# Patient Record
Sex: Female | Born: 1950 | Race: White | Hispanic: No | Marital: Married | Smoking: Former smoker
Health system: Southern US, Community
[De-identification: ages and names within clinical notes are randomized; demographics above are authoritative.]

## PROBLEM LIST (undated history)

## (undated) DIAGNOSIS — E785 Hyperlipidemia, unspecified: Secondary | ICD-10-CM

## (undated) DIAGNOSIS — J309 Allergic rhinitis, unspecified: Secondary | ICD-10-CM

## (undated) DIAGNOSIS — I10 Essential (primary) hypertension: Secondary | ICD-10-CM

## (undated) DIAGNOSIS — B019 Varicella without complication: Secondary | ICD-10-CM

## (undated) DIAGNOSIS — E119 Type 2 diabetes mellitus without complications: Secondary | ICD-10-CM

## (undated) DIAGNOSIS — Z9289 Personal history of other medical treatment: Secondary | ICD-10-CM

## (undated) DIAGNOSIS — R339 Retention of urine, unspecified: Secondary | ICD-10-CM

## (undated) DIAGNOSIS — J45909 Unspecified asthma, uncomplicated: Secondary | ICD-10-CM

## (undated) DIAGNOSIS — R519 Headache, unspecified: Secondary | ICD-10-CM

## (undated) DIAGNOSIS — C801 Malignant (primary) neoplasm, unspecified: Secondary | ICD-10-CM

## (undated) DIAGNOSIS — G459 Transient cerebral ischemic attack, unspecified: Secondary | ICD-10-CM

## (undated) DIAGNOSIS — K219 Gastro-esophageal reflux disease without esophagitis: Secondary | ICD-10-CM

## (undated) DIAGNOSIS — R51 Headache: Secondary | ICD-10-CM

## (undated) DIAGNOSIS — K5792 Diverticulitis of intestine, part unspecified, without perforation or abscess without bleeding: Secondary | ICD-10-CM

## (undated) HISTORY — DX: Retention of urine, unspecified: R33.9

## (undated) HISTORY — DX: Hyperlipidemia, unspecified: E78.5

## (undated) HISTORY — DX: Personal history of other medical treatment: Z92.89

## (undated) HISTORY — DX: Unspecified asthma, uncomplicated: J45.909

## (undated) HISTORY — PX: ABDOMINAL HYSTERECTOMY: SHX81

## (undated) HISTORY — DX: Headache, unspecified: R51.9

## (undated) HISTORY — DX: Diverticulitis of intestine, part unspecified, without perforation or abscess without bleeding: K57.92

## (undated) HISTORY — DX: Varicella without complication: B01.9

## (undated) HISTORY — DX: Headache: R51

## (undated) HISTORY — DX: Gastro-esophageal reflux disease without esophagitis: K21.9

## (undated) HISTORY — DX: Allergic rhinitis, unspecified: J30.9

---

## 1985-11-10 HISTORY — PX: ABDOMINAL HYSTERECTOMY: SUR658

## 1986-11-10 HISTORY — PX: CHOLECYSTECTOMY: SHX55

## 2005-08-04 ENCOUNTER — Ambulatory Visit: Payer: Self-pay | Admitting: Unknown Physician Specialty

## 2006-07-31 ENCOUNTER — Ambulatory Visit: Payer: Self-pay | Admitting: Internal Medicine

## 2006-08-26 ENCOUNTER — Ambulatory Visit: Payer: Self-pay | Admitting: Unknown Physician Specialty

## 2007-10-12 ENCOUNTER — Ambulatory Visit: Payer: Self-pay | Admitting: Unknown Physician Specialty

## 2007-10-27 ENCOUNTER — Ambulatory Visit: Payer: Self-pay | Admitting: Unknown Physician Specialty

## 2007-12-20 ENCOUNTER — Ambulatory Visit: Payer: Self-pay | Admitting: Internal Medicine

## 2008-02-28 ENCOUNTER — Encounter: Admission: RE | Admit: 2008-02-28 | Discharge: 2008-02-28 | Payer: Self-pay | Admitting: Neurology

## 2008-10-30 ENCOUNTER — Ambulatory Visit: Payer: Self-pay | Admitting: General Surgery

## 2009-11-07 ENCOUNTER — Ambulatory Visit: Payer: Self-pay | Admitting: Unknown Physician Specialty

## 2010-03-14 ENCOUNTER — Ambulatory Visit: Payer: Self-pay | Admitting: Internal Medicine

## 2011-04-02 ENCOUNTER — Ambulatory Visit: Payer: Self-pay | Admitting: Internal Medicine

## 2012-06-30 ENCOUNTER — Ambulatory Visit: Payer: Self-pay | Admitting: Internal Medicine

## 2013-03-09 ENCOUNTER — Emergency Department: Payer: Self-pay | Admitting: Emergency Medicine

## 2013-03-09 LAB — COMPREHENSIVE METABOLIC PANEL
Albumin: 4.1 g/dL (ref 3.4–5.0)
BUN: 19 mg/dL — ABNORMAL HIGH (ref 7–18)
Bilirubin,Total: 0.4 mg/dL (ref 0.2–1.0)
Calcium, Total: 9.2 mg/dL (ref 8.5–10.1)
Chloride: 105 mmol/L (ref 98–107)
Co2: 24 mmol/L (ref 21–32)
Creatinine: 0.9 mg/dL (ref 0.60–1.30)
EGFR (Non-African Amer.): >60
Glucose: 137 mg/dL — ABNORMAL HIGH (ref 65–99)
SGOT(AST): 46 U/L — ABNORMAL HIGH (ref 15–37)
Sodium: 137 mmol/L (ref 136–145)

## 2013-03-09 LAB — URINALYSIS, COMPLETE
Bilirubin,UR: NEGATIVE
Ketone: NEGATIVE
Leukocyte Esterase: NEGATIVE
Ph: 5 (ref 4.5–8.0)
RBC,UR: 4 /HPF (ref 0–5)
WBC UR: 1 /HPF (ref 0–5)

## 2013-03-09 LAB — CBC
HCT: 42.5 % (ref 35.0–47.0)
HGB: 14.6 g/dL (ref 12.0–16.0)
MCH: 30.7 pg (ref 26.0–34.0)
MCHC: 34.4 g/dL (ref 32.0–36.0)
RDW: 13.4 % (ref 11.5–14.5)

## 2013-07-06 ENCOUNTER — Ambulatory Visit: Payer: Self-pay | Admitting: Internal Medicine

## 2013-08-03 ENCOUNTER — Ambulatory Visit: Payer: Self-pay | Admitting: Gastroenterology

## 2013-09-13 ENCOUNTER — Ambulatory Visit: Payer: Self-pay | Admitting: Internal Medicine

## 2014-07-25 ENCOUNTER — Ambulatory Visit: Payer: Self-pay | Admitting: Internal Medicine

## 2014-10-10 DEATH — deceased

## 2014-10-23 ENCOUNTER — Ambulatory Visit: Payer: Self-pay | Admitting: Internal Medicine

## 2014-11-10 ENCOUNTER — Ambulatory Visit: Payer: Self-pay | Admitting: Internal Medicine

## 2014-12-11 ENCOUNTER — Ambulatory Visit: Payer: Self-pay | Admitting: Internal Medicine

## 2015-10-18 ENCOUNTER — Emergency Department
Admission: EM | Admit: 2015-10-18 | Discharge: 2015-10-18 | Disposition: A | Discharge status: Home / Self Care | Payer: 59 | Attending: Emergency Medicine | Admitting: Emergency Medicine

## 2015-10-18 ENCOUNTER — Emergency Department: Payer: 59

## 2015-10-18 ENCOUNTER — Encounter: Payer: Self-pay | Admitting: *Deleted

## 2015-10-18 DIAGNOSIS — I10 Essential (primary) hypertension: Secondary | ICD-10-CM | POA: Insufficient documentation

## 2015-10-18 DIAGNOSIS — D649 Anemia, unspecified: Secondary | ICD-10-CM | POA: Diagnosis not present

## 2015-10-18 DIAGNOSIS — R0602 Shortness of breath: Secondary | ICD-10-CM | POA: Diagnosis present

## 2015-10-18 DIAGNOSIS — Z880 Allergy status to penicillin: Secondary | ICD-10-CM | POA: Diagnosis not present

## 2015-10-18 DIAGNOSIS — Z87891 Personal history of nicotine dependence: Secondary | ICD-10-CM | POA: Insufficient documentation

## 2015-10-18 DIAGNOSIS — J40 Bronchitis, not specified as acute or chronic: Secondary | ICD-10-CM

## 2015-10-18 DIAGNOSIS — J209 Acute bronchitis, unspecified: Secondary | ICD-10-CM | POA: Diagnosis not present

## 2015-10-18 DIAGNOSIS — E119 Type 2 diabetes mellitus without complications: Secondary | ICD-10-CM | POA: Insufficient documentation

## 2015-10-18 HISTORY — DX: Essential (primary) hypertension: I10

## 2015-10-18 HISTORY — DX: Type 2 diabetes mellitus without complications: E11.9

## 2015-10-18 LAB — CBC WITH DIFFERENTIAL/PLATELET
BASOS ABS: 0.2 10*3/uL — AB (ref 0–0.1)
BASOS PCT: 2 %
EOS ABS: 0.3 10*3/uL (ref 0–0.7)
EOS PCT: 3 %
HCT: 30.8 % — ABNORMAL LOW (ref 35.0–47.0)
Hemoglobin: 9.5 g/dL — ABNORMAL LOW (ref 12.0–16.0)
Lymphocytes Relative: 45 %
Lymphs Abs: 4.6 10*3/uL — ABNORMAL HIGH (ref 1.0–3.6)
MCH: 21.4 pg — ABNORMAL LOW (ref 26.0–34.0)
MCHC: 30.7 g/dL — ABNORMAL LOW (ref 32.0–36.0)
MCV: 69.8 fL — ABNORMAL LOW (ref 80.0–100.0)
MONO ABS: 0.6 10*3/uL (ref 0.2–0.9)
Monocytes Relative: 6 %
Neutro Abs: 4.3 10*3/uL (ref 1.4–6.5)
Neutrophils Relative %: 44 %
PLATELETS: 370 10*3/uL (ref 150–440)
RBC: 4.41 MIL/uL (ref 3.80–5.20)
RDW: 18.6 % — AB (ref 11.5–14.5)
WBC: 10 10*3/uL (ref 3.6–11.0)

## 2015-10-18 LAB — TROPONIN I

## 2015-10-18 LAB — BASIC METABOLIC PANEL
ANION GAP: 9 (ref 5–15)
BUN: 15 mg/dL (ref 6–20)
CALCIUM: 9.7 mg/dL (ref 8.9–10.3)
CO2: 26 mmol/L (ref 22–32)
Chloride: 106 mmol/L (ref 101–111)
Creatinine, Ser: 0.69 mg/dL (ref 0.44–1.00)
GFR calc Af Amer: >60 mL/min (ref >60)
GLUCOSE: 114 mg/dL — AB (ref 65–99)
POTASSIUM: 3.8 mmol/L (ref 3.5–5.1)
SODIUM: 141 mmol/L (ref 135–145)

## 2015-10-18 MED ORDER — ALBUTEROL SULFATE (2.5 MG/3ML) 0.083% IN NEBU
5.0000 mg | INHALATION_SOLUTION | Freq: Once | RESPIRATORY_TRACT | Status: AC
  Administered 2015-10-18: 5 mg via RESPIRATORY_TRACT
  Filled 2015-10-18: qty 6

## 2015-10-18 MED ORDER — IPRATROPIUM-ALBUTEROL 0.5-2.5 (3) MG/3ML IN SOLN
RESPIRATORY_TRACT | Status: AC
  Administered 2015-10-18: 3 mL via RESPIRATORY_TRACT
  Filled 2015-10-18: qty 3

## 2015-10-18 MED ORDER — AZITHROMYCIN 250 MG PO TABS
500.0000 mg | ORAL_TABLET | Freq: Once | ORAL | Status: AC
  Administered 2015-10-18: 500 mg via ORAL
  Filled 2015-10-18: qty 2

## 2015-10-18 MED ORDER — AZITHROMYCIN 250 MG PO TABS: ORAL_TABLET | ORAL | Status: DC

## 2015-10-18 MED ORDER — PREDNISONE 20 MG PO TABS
60.0000 mg | ORAL_TABLET | Freq: Once | ORAL | Status: AC
  Administered 2015-10-18: 60 mg via ORAL
  Filled 2015-10-18: qty 3

## 2015-10-18 MED ORDER — IPRATROPIUM-ALBUTEROL 0.5-2.5 (3) MG/3ML IN SOLN
3.0000 mL | Freq: Once | RESPIRATORY_TRACT | Status: AC
  Administered 2015-10-18: 3 mL via RESPIRATORY_TRACT

## 2015-10-18 MED ORDER — ALBUTEROL SULFATE HFA 108 (90 BASE) MCG/ACT IN AERS: 2.0000 | INHALATION_SPRAY | Freq: Four times a day (QID) | RESPIRATORY_TRACT | Status: AC | PRN

## 2015-10-18 MED ORDER — PREDNISONE 20 MG PO TABS: 60.0000 mg | ORAL_TABLET | Freq: Every day | ORAL | Status: DC

## 2015-10-18 NOTE — ED Notes (Signed)
Patient transported to X-ray 

## 2015-10-18 NOTE — ED Notes (Signed)
Pt states has had cough, lightheadedness, and weak for about 1 year, non productive, tightness to chest with coughing.  States dx with HTN and DBM about 1 year ago as well.  Pt sounds clear and diminished after first breathing treatment in ED, chest rise symmetrical.

## 2015-10-18 NOTE — ED Provider Notes (Addendum)
[facility] Emergency Department Provider Note  ____________________________________________  Time seen: Approximately 7 PM  I have reviewed the triage vital signs and the nursing notes.   HISTORY  Chief Complaint Cough and Shortness of Breath    HPI Tamara Summers is a 64 y.o. female with a history of one year of cough was presenting today with shortness of breath. She said that she had a coughing spell several hours ago and is now complaining of tightness in her chest with coughing and difficulty breathing. She says that she is a former smoker but no longer smokes. Says that she has had to use nebulizer treatments intermittently at home for relief of her coughing.Says that she has also seen a pulmonologist and she says that she may have COPD but does not have a definitive diagnosis at this time. Denies any fevers. The chest tightness is diffuse and worse with breathing.   Past Medical History  Diagnosis Date  . Hypertension   . Diabetes mellitus without complication ([place])     There are no active problems to display for this patient.   History reviewed. No pertinent past surgical history.  No current outpatient prescriptions on file.  Allergies Penicillins and Sulfa antibiotics  History reviewed. No pertinent family history.  Social History Social History  Substance Use Topics  . Smoking status: Former Research scientist (life sciences)  . Smokeless tobacco: None  . Alcohol Use: None    Review of Systems Constitutional: No fever/chills Eyes: No visual changes. ENT: No sore throat. Cardiovascular: "Tightness" as above Respiratory: As above  Gastrointestinal: No abdominal pain.  No nausea, no vomiting.  No diarrhea.  No constipation. Genitourinary: Negative for dysuria. Musculoskeletal: Negative for back pain. Skin: Negative for rash. Neurological: Negative for headaches, focal weakness or numbness.  10-point ROS otherwise  negative.  ____________________________________________   PHYSICAL EXAM:  VITAL SIGNS: ED Triage Vitals  Enc Vitals Group     BP 10/18/15 1819 159/99 mmHg     Pulse Rate 10/18/15 1819 99     Resp 10/18/15 1819 20     Temp 10/18/15 1819 98.1 F (36.7 C)     Temp Source 10/18/15 1819 Oral     SpO2 10/18/15 1819 98 %     Weight 10/18/15 1819 138 lb (62.596 kg)     Height 10/18/15 1819 5\' 3"  (1.6 m)     Head Cir --      Peak Flow --      Pain Score 10/18/15 1819 0     Pain Loc --      Pain Edu? --      Excl. in [place]? --     Constitutional: Alert and oriented. Well appearing and in no acute distress. Eyes: Conjunctivae are normal. PERRL. EOMI. Head: Atraumatic. Nose: No congestion/rhinnorhea. Mouth/Throat: Mucous membranes are moist.  Oropharynx non-erythematous. Neck: No stridor.   Cardiovascular: Normal rate, regular rhythm. Grossly normal heart sounds.  Good peripheral circulation. Respiratory: Normal respiratory effort.  No retractions. Decreased air movement throughout with expiratory wheeze as well as prolonged expiratory phase. Gastrointestinal: Soft and nontender. No distention. No abdominal bruits. No CVA tenderness. Musculoskeletal: No lower extremity tenderness nor edema.  No joint effusions. Neurologic:  Normal speech and language. No gross focal neurologic deficits are appreciated. No gait instability. Skin:  Skin is warm, dry and intact. No rash noted. Psychiatric: Mood and affect are normal. Speech and behavior are normal.  ____________________________________________   LABS (all labs ordered are listed, but only abnormal results are  displayed)  Labs Reviewed  CBC WITH DIFFERENTIAL/PLATELET - Abnormal; Notable for the following:    Hemoglobin 9.5 (*)    HCT 30.8 (*)    MCV 69.8 (*)    MCH 21.4 (*)    MCHC 30.7 (*)    RDW 18.6 (*)    Lymphs Abs 4.6 (*)    Basophils Absolute 0.2 (*)    All other components within normal limits  BASIC METABOLIC PANEL -  Abnormal; Notable for the following:    Glucose, Bld 114 (*)    All other components within normal limits  TROPONIN I   ____________________________________________  EKG  ED ECG REPORT I, Doran Stabler, the attending physician, personally viewed and interpreted this ECG.   Date: 10/18/2015  EKG Time: 1823  Rate: 96  Rhythm: normal sinus rhythm  Axis: Normal axis  Intervals:none  ST&T Change: No ST segment elevation or depressions. No abnormal T-wave inversion.  ____________________________________________  RADIOLOGY  Mild peribronchial thickening without any consolidation. ____________________________________________   PROCEDURES   ____________________________________________   INITIAL IMPRESSION / ASSESSMENT AND PLAN / ED COURSE  Pertinent labs & imaging results that were available during my care of the patient were reviewed by me and considered in my medical decision making (see chart for details).  ----------------------------------------- 8:34 PM on 10/18/2015 -----------------------------------------  Patient resting comfortably at this time with improved breathing without any shortness of breath, tightness or chest pain. Auscultation of the chest with clear lungs without any wheezing or prolonged expiratory phase or x-ray cough. Workup is reassuring except for a hemoglobin of 9.5 down from 14 in 2014. Patient does say that she has felt increasingly weak over the past year. Denies any noticeable bleeding in her stool says that she has had hematuria chronically and is scheduled to see a urologist next week. I did a rectal exam with grossly brown stool that was Hemoccult negative. I advised the patient that she should start iron over-the-counter. She'll follow up with her primary care doctor. The anemia appears chronic ____________________________________________   FINAL CLINICAL IMPRESSION(S) / ED DIAGNOSES  Bronchitis. Anemia.    Orbie Pyo, MD 10/18/15 2035  Orbie Pyo, MD 10/18/15 2039

## 2015-10-18 NOTE — ED Notes (Signed)
Pt states she had a coughing spell this afternoon and now states SOB and tightness, audible wheezing herard

## 2015-10-29 ENCOUNTER — Encounter: Payer: Self-pay | Admitting: Family Medicine

## 2015-10-29 ENCOUNTER — Ambulatory Visit (INDEPENDENT_AMBULATORY_CARE_PROVIDER_SITE_OTHER): Payer: 59 | Admitting: Family Medicine

## 2015-10-29 VITALS — BP 122/78 | HR 91 | Temp 97.9°F | Ht 64.0 in | Wt 139.8 lb

## 2015-10-29 DIAGNOSIS — E119 Type 2 diabetes mellitus without complications: Secondary | ICD-10-CM | POA: Diagnosis not present

## 2015-10-29 DIAGNOSIS — Z1211 Encounter for screening for malignant neoplasm of colon: Secondary | ICD-10-CM

## 2015-10-29 DIAGNOSIS — R0602 Shortness of breath: Secondary | ICD-10-CM | POA: Insufficient documentation

## 2015-10-29 DIAGNOSIS — R000 Tachycardia, unspecified: Secondary | ICD-10-CM

## 2015-10-29 DIAGNOSIS — D649 Anemia, unspecified: Secondary | ICD-10-CM

## 2015-10-29 NOTE — Assessment & Plan Note (Addendum)
Diagnosed with anemia in the ED a week and a half ago. Hemoglobin was 9.5. Unknown cause. Suspect that her symptoms of shortness of breath and increased heart rate are related to her anemia. She's been evaluated by urology given her history of hematuria and per the patient this evaluation was negative. She does not have any vaginal bleeding as she had a hysterectomy many years ago. She denies rectal bleeding and had negative FOBT in the ED. Suspect iron deficiency anemia given MCV, but the question is why is she anemic. I did discuss that a single FOBT in the ED is not a good way to rule out rectal bleeding. We will give her stool cards and refer her to GI for colonoscopy. We will obtain iron studies today. We'll recheck her hemoglobin today. Given return precautions.

## 2015-10-29 NOTE — Patient Instructions (Signed)
Nice to meet you. Please continue the iron supplement. We will check iron studies today and a repeat hemoglobin. We will give the stool cards that she needed to complete. We'll refer you to GI. We will also check an A1c for your diabetes. If you develop trouble breathing, palpitations, chest pain or pressure, swelling of one leg or the other, cough, fever, or any new or change in symptoms please seek medical attention.

## 2015-10-29 NOTE — Assessment & Plan Note (Addendum)
Patient with multiple likely causes of this issue. Suspect is related to her anemia and the acute bronchitis that she had. This issue has improved, though does mildly persist. Discussed possible other causes including COPD, asthma, cardiac cause, or VTE. Patient does not have any risk factors for VTE and has a wells score of 0 today. Doubt cardiac cause given normal EKG and troponin in the ED. Could be COPD or asthma related given that this improved with treatment of her bronchitis with steroids and an inhaler. Lung sounds are normal today. Oxygenation is normal today. Doubt persistent infectious cause. Did discuss having her evaluated by pulmonology for COPD or cardiology for cardiac cause, though patient opted to focus on anemia as a cause as this is most likely cause of this issue right now. We will treat her anemia and work it up per the anemia problem. She will continue to monitor. She is given return precautions.

## 2015-10-29 NOTE — Progress Notes (Signed)
Patient ID: Tamara Summers, female   DOB: [birth year], 64 y.o.   MRN: [id number]  [name], MD Phone: [phone]  Tamara Summers is a 64 y.o. female who presents today for new patient visit.  Patient notes she was diagnosed with anemia in the ED on 10/18/15. Hemoglobin was 9.5. She notes she had gone to the ED as she had felt tired for the past year and has shortness of breath with exertion for the past year. She also noted her heart rate had been high when checked at home. She notes that the day she went to the emergency room she started wheezing and having tightness in her chest. She did not have any chest pressure or fevers or hemoptysis with this. She was diagnosed with an acute bronchitis and anemia as likely causes of her symptoms. She was given a breathing treatment and sent home with steroids, and antibiotics, and an inhaler. She was advised to take iron. She notes a history of asthma and possible COPD. She possibly has had hematuria in the past, though saw urology at [facility] about a week ago and they could not find a cause per the patient. She had a hysterectomy in the 1980s and has not had any vaginal bleeding since then. She had a colonoscopy 6-7 years ago that found diverticulosis. She denies blood per rectum. She had a negative FOBT in the ED. She notes since being treated for acute bronchitis the tightness in her chest and shortness of breath have improved. She does still note some mild shortness of breath with exercising. No orthopnea or PND. No recent surgeries or long trips. No history of malignancy. She does note that occasionally her pulse feels like it's racing. This occurs most days at home. Typically in the 108-110 range. She is asymptomatic otherwise with that. Other ED workup included a normal EKG and a negative troponin. Chest x-ray was consistent with bronchitis.  DIABETES Disease Monitoring: Blood Sugar ranges-typically 140-160, though recently has been 180s to  230s Polyuria/phagia/dipsia-some polydipsia      Medications: Compliance-takes metformin XR 1000 mg once daily and glimepiride 2 mg daily Hypoglycemic symptoms- no   Active Ambulatory Problems    Diagnosis Date Noted  . Anemia 10/29/2015  . Shortness of breath 10/29/2015  . Increased heart rate 10/29/2015  . Diabetes mellitus type 2, controlled, without complications ([place]) 0000000   Resolved Ambulatory Problems    Diagnosis Date Noted  . No Resolved Ambulatory Problems   Past Medical History  Diagnosis Date  . Hypertension   . Diabetes mellitus without complication ([place])   . Asthma   . Chickenpox   . Diverticulitis   . Headache   . GERD (gastroesophageal reflux disease)   . Allergic rhinitis   . Hyperlipidemia   . History of blood transfusion   . Urinary retention     Family History  Problem Relation Age of Onset  . Hyperlipidemia      Parent  . Heart disease      Parent  . Hypertension      Parent  . Stroke      Grandparent  . Mental illness Sister     Social History   Social History  . Marital Status: Married    Spouse Name: N/A  . Number of Children: N/A  . Years of Education: N/A   Occupational History  . Not on file.   Social History Main Topics  . Smoking status: Former Research scientist (life sciences)  . Smokeless tobacco: Not on file  .  Alcohol Use: 1.2 - 1.8 oz/week    2-3 Standard drinks or equivalent per week  . Drug Use: No  . Sexual Activity: Not on file   Other Topics Concern  . Not on file   Social History Narrative    ROS   General:  Negative for nexplained weight loss, fever Skin: Negative for new or changing mole, sore that won't heal HEENT: Negative for trouble hearing, trouble seeing, ringing in ears, mouth sores, hoarseness, change in voice, dysphagia. CV:  Positive for dyspnea and increased heart rate, Negative for chest pain, edema Resp: Positive for cough, dyspnea, Negative for hemoptysis GI: Negative for nausea, vomiting, diarrhea,  constipation, abdominal pain, melena, hematochezia. GU: Negative for dysuria, incontinence, urinary hesitance, hematuria, vaginal or penile discharge, polyuria, sexual difficulty, lumps in testicle or breasts MSK: Negative for muscle cramps or aches, joint pain or swelling Neuro: Negative for headaches, weakness, numbness, dizziness, passing out/fainting Psych: Negative for depression, anxiety, memory problems  Objective  Physical Exam Filed Vitals:   10/29/15 0812  BP: 122/78  Pulse: 91  Temp: 97.9 F (36.6 C)    BP Readings from Last 3 Encounters:  10/29/15 122/78  10/18/15 157/73   Wt Readings from Last 3 Encounters:  10/29/15 139 lb 12.8 oz (63.413 kg)  10/18/15 138 lb (62.596 kg)    Physical Exam  Constitutional: She is well-developed, well-nourished, and in no distress.  HENT:  Head: Normocephalic and atraumatic.  Right Ear: External ear normal.  Left Ear: External ear normal.  Mouth/Throat: Oropharynx is clear and moist. No oropharyngeal exudate.  Eyes: Pupils are equal, round, and reactive to light.  Mild conjunctival pallor  Neck: Neck supple.  Cardiovascular: Normal rate, regular rhythm and normal heart sounds.  Exam reveals no gallop and no friction rub.   No murmur heard. Pulmonary/Chest: Effort normal and breath sounds normal. No respiratory distress. She has no wheezes. She has no rales.  Abdominal: Soft. Bowel sounds are normal. She exhibits no distension. There is no tenderness. There is no rebound and no guarding.  Musculoskeletal: She exhibits no edema.  No calf swelling or tenderness  Lymphadenopathy:    She has no cervical adenopathy.  Neurological: She is alert. Gait normal.  Skin: Skin is warm and dry. She is not diaphoretic.  Psychiatric: Mood and affect normal.     Assessment/Plan:   Anemia Diagnosed with anemia in the ED a week and a half ago. Hemoglobin was 9.5. Unknown cause. Suspect that her symptoms of shortness of breath and increased  heart rate are related to her anemia. She's been evaluated by urology given her history of hematuria and per the patient this evaluation was negative. She does not have any vaginal bleeding as she had a hysterectomy many years ago. She denies rectal bleeding and had negative FOBT in the ED. Suspect iron deficiency anemia given MCV, but the question is why is she anemic. I did discuss that a single FOBT in the ED is not a good way to rule out rectal bleeding. We will give her stool cards and refer her to GI for colonoscopy. We will obtain iron studies today. We'll recheck her hemoglobin today. Given return precautions.  Shortness of breath Patient with multiple likely causes of this issue. Suspect is related to her anemia and the acute bronchitis that she had. This issue has improved, though does mildly persist. Discussed possible other causes including COPD, asthma, cardiac cause, or VTE. Patient does not have any risk factors for VTE and  has a wells score of 0 today. Doubt cardiac cause given normal EKG and troponin in the ED. Could be COPD or asthma related given that this improved with treatment of her bronchitis with steroids and an inhaler. Lung sounds are normal today. Oxygenation is normal today. Doubt persistent infectious cause. Did discuss having her evaluated by pulmonology for COPD or cardiology for cardiac cause, though patient opted to focus on anemia as a cause as this is most likely cause of this issue right now. We will treat her anemia and work it up per the anemia problem. She will continue to monitor. She is given return precautions.  Increased heart rate Patient report she has increased heart rate at home. Suspect this is related to the anemia. She had an EKG in the ED that was normal. Discussed other causes with patient. Did discuss cardiology referral, the patient opted to treat the anemia and see how this responded prior to proceeding with any further workup. Given return  precautions.  Diabetes mellitus type 2, controlled, without complications ([place]) Seems to have been well controlled until recently. We will check an A1c today. She will continue her metformin and glimepiride.    Orders Placed This Encounter  Procedures  . CBC with Differential/Platelet    Standing Status: Future     Number of Occurrences: 1     Standing Expiration Date: 10/28/2016  . Ferritin  . Hemoglobin A1c  . Iron Binding Cap (TIBC)  . Ambulatory referral to Gastroenterology    Referral Priority:  Routine    Referral Type:  Consultation    Referral Reason:  Specialty Services Required    Number of Visits Requested:  1    Dragon voice recognition software was used during the dictation process of this note. If any phrases or words seem inappropriate it is likely secondary to the translation process being inefficient.  [name]

## 2015-10-29 NOTE — Progress Notes (Signed)
Pre visit review using our clinic review tool, if applicable. No additional management support is needed unless otherwise documented below in the visit note. 

## 2015-10-29 NOTE — Assessment & Plan Note (Signed)
Seems to have been well controlled until recently. We will check an A1c today. She will continue her metformin and glimepiride.

## 2015-10-29 NOTE — Assessment & Plan Note (Addendum)
Patient report she has increased heart rate at home. Suspect this is related to the anemia. She had an EKG in the ED that was normal. Discussed other causes with patient. Did discuss cardiology referral, the patient opted to treat the anemia and see how this responded prior to proceeding with any further workup. Given return precautions.

## 2015-10-29 NOTE — Progress Notes (Deleted)
Patient ID: Tamara Summers, female   DOB: 10-Feb-1951, 64 y.o.   MRN: [id number]  [name], MD Phone: [phone]  Tamara Summers is a 64 y.o. female who presents today for ***  ***  PMH: nonsmoker ***   ROS  Objective  Physical Exam Filed Vitals:   10/29/15 0812  BP: 122/78  Pulse: 91  Temp: 97.9 F (36.6 C)    BP Readings from Last 3 Encounters:  10/29/15 122/78  10/18/15 157/73   Wt Readings from Last 3 Encounters:  10/29/15 139 lb 12.8 oz (63.413 kg)  10/18/15 138 lb (62.596 kg)    Physical Exam   Assessment/Plan: Please see individual problem list.  No problem-specific assessment & plan notes found for this encounter.   Orders Placed This Encounter  Procedures  . CBC  . Iron and TIBC  . Ferritin  . Hemoglobin A1C  . Ambulatory referral to Gastroenterology    Referral Priority:  Routine    Referral Type:  Consultation    Referral Reason:  Specialty Services Required    Number of Visits Requested:  1    Meds ordered this encounter  Medications  . atorvastatin (LIPITOR) 40 MG tablet    Sig: Take by mouth.  . Cholecalciferol (D 2000) 2000 UNITS TABS    Sig: Take by mouth.  . dexlansoprazole (DEXILANT) 60 MG capsule    Sig: Take 1 capsule by mouth  once a day  . escitalopram (LEXAPRO) 10 MG tablet    Sig: Take 1 tablet by mouth once a day  . glimepiride (AMARYL) 2 MG tablet    Sig: Take by mouth.  . hydrochlorothiazide (HYDRODIURIL) 12.5 MG tablet    Sig: Take by mouth.  . B-D ULTRA-FINE 33 LANCETS MISC    Sig: Use 1 Units/day once daily.  Marland Kitchen glucose blood (ONE TOUCH ULTRA TEST) test strip    Sig:   . tolterodine (DETROL LA) 2 MG 24 hr capsule    Sig: Take by mouth.  . metFORMIN (GLUCOPHAGE-XR) 500 MG 24 hr tablet    Sig:     # Healthcare maintenance: ***  Dragon voice recognition software was used during the dictation process of this note. If any phrases or words seem inappropriate it is likely secondary to the translation process  being inefficient.  [name]

## 2015-10-30 LAB — CBC WITH DIFFERENTIAL/PLATELET
BASOS ABS: 0.2 10*3/uL (ref 0.0–0.2)
Basos: 2 %
EOS (ABSOLUTE): 0.2 10*3/uL (ref 0.0–0.4)
Eos: 3 %
Hematocrit: 31.3 % — ABNORMAL LOW (ref 34.0–46.6)
Hemoglobin: 9.8 g/dL — ABNORMAL LOW (ref 11.1–15.9)
IMMATURE GRANS (ABS): 0 10*3/uL (ref 0.0–0.1)
IMMATURE GRANULOCYTES: 0 %
LYMPHS: 36 %
Lymphocytes Absolute: 2.7 10*3/uL (ref 0.7–3.1)
MCH: 22.2 pg — ABNORMAL LOW (ref 26.6–33.0)
MCHC: 31.3 g/dL — ABNORMAL LOW (ref 31.5–35.7)
MCV: 71 fL — ABNORMAL LOW (ref 79–97)
Monocytes Absolute: 0.4 10*3/uL (ref 0.1–0.9)
Monocytes: 6 %
NEUTROS PCT: 53 %
Neutrophils Absolute: 4.1 10*3/uL (ref 1.4–7.0)
PLATELETS: 414 10*3/uL — AB (ref 150–379)
RBC: 4.41 x10E6/uL (ref 3.77–5.28)
RDW: 20.8 % — AB (ref 12.3–15.4)
WBC: 7.6 10*3/uL (ref 3.4–10.8)

## 2015-10-30 LAB — FERRITIN: Ferritin: 19 ng/mL (ref 15–150)

## 2015-10-30 LAB — IRON AND TIBC
IRON SATURATION: 7 % — AB (ref 15–55)
IRON: 33 ug/dL (ref 27–139)
Total Iron Binding Capacity: 492 ug/dL — ABNORMAL HIGH (ref 250–450)
UIBC: 459 ug/dL — ABNORMAL HIGH (ref 118–369)

## 2015-10-30 LAB — HEMOGLOBIN A1C
Est. average glucose Bld gHb Est-mCnc: 163 mg/dL
Hgb A1c MFr Bld: 7.3 % — ABNORMAL HIGH (ref 4.8–5.6)

## 2015-11-29 ENCOUNTER — Ambulatory Visit (INDEPENDENT_AMBULATORY_CARE_PROVIDER_SITE_OTHER): Payer: 59 | Admitting: Family Medicine

## 2015-11-29 ENCOUNTER — Encounter: Payer: Self-pay | Admitting: Family Medicine

## 2015-11-29 VITALS — BP 124/72 | HR 88 | Temp 98.0°F | Ht 64.0 in | Wt 142.0 lb

## 2015-11-29 DIAGNOSIS — I10 Essential (primary) hypertension: Secondary | ICD-10-CM

## 2015-11-29 DIAGNOSIS — E119 Type 2 diabetes mellitus without complications: Secondary | ICD-10-CM | POA: Diagnosis not present

## 2015-11-29 DIAGNOSIS — D649 Anemia, unspecified: Secondary | ICD-10-CM

## 2015-11-29 NOTE — Assessment & Plan Note (Signed)
At goal on HCTZ. Will continue HCTZ.

## 2015-11-29 NOTE — Progress Notes (Signed)
Pre visit review using our clinic review tool, if applicable. No additional management support is needed unless otherwise documented below in the visit note. 

## 2015-11-29 NOTE — Patient Instructions (Addendum)
Nice to see you.  please proceed with colonoscopy and endoscopy.  please continue diabetic medications as you have been. Please continue your blood pressure medications as you have been. Monitor your shortness of breath. If this worsens or he develop chest pain or leg swelling or fever or any new or changing symptoms please seek medical attention.  Diet Recommendations  Starchy (carb) foods: Bread, rice, pasta, potatoes, corn, cereal, grits, crackers, bagels, muffins, all baked goods.  (Fruits, milk, and yogurt also have carbohydrate, but most of these foods will not spike your blood sugar as the starchy foods will.)  A few fruits do cause high blood sugars; use small portions of bananas (limit to 1/2 at a time), grapes, watermelon, oranges, and most tropical fruits.    Protein foods: Meat, fish, poultry, eggs, dairy foods, and beans such as pinto and kidney beans (beans also provide carbohydrate).   1. Eat at least 3 meals and 1-2 snacks per day. Never go more than 4-5 hours while awake without eating. Eat breakfast within the first hour of getting up.   2. Limit starchy foods to TWO per meal and ONE per snack. ONE portion of a starchy  food is equal to the following:   - ONE slice of bread (or its equivalent, such as half of a hamburger bun).   - 1/2 cup of a "scoopable" starchy food such as potatoes or rice.   - 15 grams of carbohydrate as shown on food label.  3. Include at every meal: a protein food, a carb food, and vegetables and/or fruit.   - Obtain twice the volume of veg's as protein or carbohydrate foods for both lunch and dinner.   - Fresh or frozen veg's are best.   - Keep frozen veg's on hand for a quick vegetable serving.

## 2015-11-29 NOTE — Assessment & Plan Note (Signed)
Blood sugars appear relatively well controlled. She is currently taking 1500 mg of metformin XR daily advised her that she should take the entirety of the 1500 mg all at once. She's also on glimepiride. She'll continue to monitor blood sugars and we'll check an A1c in 2 months. We'll check foot exam and urine protein creatinine ratio at next office visit.

## 2015-11-29 NOTE — Assessment & Plan Note (Signed)
Hemoglobin appears improved. Patient's symptoms have been improving as well. Suspect minimal shortness of breath is related to her anemia. Doubt cardiac cause given lack of chest pain and negative workup in ED previously. Doubt VTE or pulmonary cause given stable vital signs. Did discuss that shortness of breath could be cardiac given that she had negative workup in the ED, though patient declines cardiac evaluation with cardiology. She'll continue iron and complete follow-up with GI for colonoscopy and endoscopy. She's given return precautions.

## 2015-11-29 NOTE — Progress Notes (Signed)
Patient ID: Tamara Summers, female   DOB: 02-05-1951, 65 y.o.   MRN: [id number]  [name], MD Phone: [phone]  Tamara Summers is a 65 y.o. female who presents today for follow-up.  Anemia: Patient notes her symptoms are improved. She's been taking the iron tablets. She denies palpitations, chest pain, and lightheadedness. She does note some tiredness. She does notes minimal shortness of breath when walking for too long. She had her hemoglobin recently checked and it was 10.8. Denies blood in her stool, hematemesis, vaginal bleeding, and hematuria. Negative urinalysis reviewed in care everywhere. She has seen GI and is scheduled for colonoscopy and endoscopy. She notes no history of blood clot, unilateral leg swelling, fevers, trips, or recent surgeries. Denies cardiac history.  DIABETES Disease Monitoring: Blood Sugar ranges-127-180 Polyuria/phagia/dipsia- no      Visual problems- no Medications: Compliance- taking metformin XR and glimepiride Hypoglycemic symptoms- rare, notes she gets shaky and dizzy and eat something and this improves. Only time she checked her sugar when this occurs it was 74.  HYPERTENSION Disease Monitoring Home BP Monitoring 130s/78-85 Chest pain- no    Dyspnea- see above Medications Compliance-  taking HCTZ. Edema- no   PMH: Former smoker.   ROS history of present illness  Objective  Physical Exam Filed Vitals:   11/29/15 0853  BP: 124/72  Pulse: 88  Temp: 98 F (36.7 C)    BP Readings from Last 3 Encounters:  11/29/15 124/72  10/29/15 122/78  10/18/15 157/73   Wt Readings from Last 3 Encounters:  11/29/15 142 lb (64.411 kg)  10/29/15 139 lb 12.8 oz (63.413 kg)  10/18/15 138 lb (62.596 kg)    Physical Exam  Constitutional: She is well-developed, well-nourished, and in no distress.  HENT:  Head: Normocephalic and atraumatic.  Right Ear: External ear normal.  Left Ear: External ear normal.  Mouth/Throat: Oropharynx is clear and  moist. No oropharyngeal exudate.  Eyes: Conjunctivae are normal. Pupils are equal, round, and reactive to light.  Neck: Neck supple.  Cardiovascular: Normal rate, regular rhythm and normal heart sounds.   Pulmonary/Chest: Effort normal and breath sounds normal.  Abdominal: Soft. Bowel sounds are normal. She exhibits no distension. There is no tenderness. There is no rebound.  Musculoskeletal: She exhibits no edema.  No calf swelling or tenderness  Lymphadenopathy:    She has no cervical adenopathy.  Neurological: She is alert. Gait normal.  Skin: Skin is warm and dry. She is not diaphoretic.     Assessment/Plan: Please see individual problem list.  Anemia Hemoglobin appears improved. Patient's symptoms have been improving as well. Suspect minimal shortness of breath is related to her anemia. Doubt cardiac cause given lack of chest pain and negative workup in ED previously. Doubt VTE or pulmonary cause given stable vital signs. Did discuss that shortness of breath could be cardiac given that she had negative workup in the ED, though patient declines cardiac evaluation with cardiology. She'll continue iron and complete follow-up with GI for colonoscopy and endoscopy. She's given return precautions.  Diabetes mellitus type 2, controlled, without complications ([place]) Blood sugars appear relatively well controlled. She is currently taking 1500 mg of metformin XR daily advised her that she should take the entirety of the 1500 mg all at once. She's also on glimepiride. She'll continue to monitor blood sugars and we'll check an A1c in 2 months. We'll check foot exam and urine protein creatinine ratio at next office visit.  Essential hypertension At goal on HCTZ. Will continue HCTZ.  [name]

## 2015-11-30 ENCOUNTER — Other Ambulatory Visit: Payer: Self-pay | Admitting: Family Medicine

## 2015-11-30 MED ORDER — HYDROCHLOROTHIAZIDE 12.5 MG PO TABS: 12.5000 mg | ORAL_TABLET | Freq: Every day | ORAL | Status: DC

## 2015-11-30 MED ORDER — METFORMIN HCL ER 500 MG PO TB24: 1500.0000 mg | ORAL_TABLET | Freq: Every day | ORAL | Status: DC

## 2015-11-30 NOTE — Telephone Encounter (Signed)
Last OV 10/29/15, 11/28/14 The Metformin, HCTZ are historical medications that you have not prescribed. She also is wanting them to go to [facility]. Please Advise

## 2015-11-30 NOTE — Telephone Encounter (Signed)
Refill given

## 2015-12-21 ENCOUNTER — Encounter: Payer: Self-pay | Admitting: Family Medicine

## 2015-12-21 ENCOUNTER — Ambulatory Visit (INDEPENDENT_AMBULATORY_CARE_PROVIDER_SITE_OTHER): Payer: 59 | Admitting: Family Medicine

## 2015-12-21 VITALS — BP 138/88 | HR 99 | Temp 98.4°F | Ht 64.0 in | Wt 142.8 lb

## 2015-12-21 DIAGNOSIS — J209 Acute bronchitis, unspecified: Secondary | ICD-10-CM

## 2015-12-21 MED ORDER — PREDNISONE 10 MG PO TABS: ORAL_TABLET | ORAL | Status: DC

## 2015-12-21 MED ORDER — BENZONATATE 200 MG PO CAPS: 200.0000 mg | ORAL_CAPSULE | Freq: Three times a day (TID) | ORAL | Status: DC | PRN

## 2015-12-21 NOTE — Progress Notes (Signed)
Patient ID: Tamara Summers, female   DOB: 17-Sep-1951, 66 y.o.   MRN: [id number]  Tamara Rumps, MD Phone: [phone]  Tamara Summers is a 65 y.o. female who presents today for same-day visit.  Patient notes she was seen at the walk-in clinic on Sunday for cough, congestion, and mild headache. Notes sinus congestion with this. No wheezing, chest pain, or shortness of breath at that time. She was started on doxycycline and given Tussionex for this. She notes her congestion symptoms have improved. She has been wheezing some at home. Minimal shortness of breath with the cough, though no other shortness of breath. Cough is the most bothersome thing to her at this point. No chest pain. No fevers. She's been using her inhaler every 6 hours and this helps. She additionally notes yesterday she slept on her left arm wrong and it fell asleep while she is on the couch. Noted some tingling in sensation of it being asleep when she woke up. Returned to normal after 15-20 minutes. She had no chest pain, shortness of breath, or diaphoresis with this. It is not exertional. It resolved with change in position.  PMH: Former smoker.   ROS see history of present illness  Objective  Physical Exam Filed Vitals:   12/21/15 0908  BP: 138/88  Pulse: 99  Temp: 98.4 F (36.9 C)    Physical Exam  Constitutional: She is well-developed, well-nourished, and in no distress.  HENT:  Head: Normocephalic and atraumatic.  Right Ear: External ear normal.  Left Ear: External ear normal.  Mouth/Throat: Oropharynx is clear and moist. No oropharyngeal exudate.  Eyes: Conjunctivae are normal. Pupils are equal, round, and reactive to light.  Neck: Neck supple.  Cardiovascular: Normal rate, regular rhythm and normal heart sounds.  Exam reveals no gallop and no friction rub.   No murmur heard. Pulmonary/Chest: Effort normal and breath sounds normal. No respiratory distress. She has no wheezes. She has no rales.    Lymphadenopathy:    She has no cervical adenopathy.  Neurological:  5 out of 5 strength in bilateral biceps, triceps, grip, sensation to light touch intact bilateral upper extremities, hands warm and well-perfused  Skin: She is not diaphoretic.     Assessment/Plan: Please see individual problem list.  Acute bronchitis Patient's symptoms consistent with acute bronchitis. She is in the midst of doxycycline treatment. She's noted some wheezing and mild shortness of breath with cough. No other shortness of breath. No chest pain. We will add prednisone taper and Tessalon for cough. She will continue her inhaler. Suspect that she slept on her arm in a poor position causing nerve compression and it to have a sensation of falling asleep. She had no cardiac symptoms with this. It resolved on its own and has not recurred. Neurologically intact. She'll continue to monitor this. She is given return precautions.    Meds ordered this encounter  Medications  . DISCONTD: predniSONE (DELTASONE) 10 MG tablet    Sig: Please take 60 mg (6 tablets) by mouth today, then decrease by one tablet daily until gone    Dispense:  21 tablet    Refill:  0  . DISCONTD: benzonatate (TESSALON) 200 MG capsule    Sig: Take 1 capsule (200 mg total) by mouth 3 (three) times daily as needed for cough.    Dispense:  20 capsule    Refill:  0  . predniSONE (DELTASONE) 10 MG tablet    Sig: Please take 60 mg (6 tablets) by mouth today, then  decrease by one tablet daily until gone    Dispense:  21 tablet    Refill:  0  . benzonatate (TESSALON) 200 MG capsule    Sig: Take 1 capsule (200 mg total) by mouth 3 (three) times daily as needed for cough.    Dispense:  20 capsule    Refill:  0     Tamara Summers

## 2015-12-21 NOTE — Progress Notes (Signed)
Pre visit review using our clinic review tool, if applicable. No additional management support is needed unless otherwise documented below in the visit note. 

## 2015-12-21 NOTE — Assessment & Plan Note (Signed)
Patient's symptoms consistent with acute bronchitis. She is in the midst of doxycycline treatment. She's noted some wheezing and mild shortness of breath with cough. No other shortness of breath. No chest pain. We will add prednisone taper and Tessalon for cough. She will continue her inhaler. Suspect that she slept on her arm in a poor position causing nerve compression and it to have a sensation of falling asleep. She had no cardiac symptoms with this. It resolved on its own and has not recurred. Neurologically intact. She'll continue to monitor this. She is given return precautions.

## 2015-12-21 NOTE — Patient Instructions (Signed)
Nice to see you. We're going to add prednisone to your treatment. She should continue to use her inhaler. If you develops shortness of breath, chest pain, sweating, fevers, or any new or change in symptoms please seek medical attention.

## 2016-01-01 ENCOUNTER — Encounter: Payer: Self-pay | Admitting: Family Medicine

## 2016-01-01 ENCOUNTER — Ambulatory Visit (INDEPENDENT_AMBULATORY_CARE_PROVIDER_SITE_OTHER): Payer: 59 | Admitting: Family Medicine

## 2016-01-01 VITALS — BP 116/74 | HR 96 | Temp 98.3°F | Ht 64.0 in | Wt 143.0 lb

## 2016-01-01 DIAGNOSIS — E785 Hyperlipidemia, unspecified: Secondary | ICD-10-CM | POA: Insufficient documentation

## 2016-01-01 DIAGNOSIS — Z114 Encounter for screening for human immunodeficiency virus [HIV]: Secondary | ICD-10-CM

## 2016-01-01 DIAGNOSIS — Z789 Other specified health status: Secondary | ICD-10-CM

## 2016-01-01 DIAGNOSIS — Z1159 Encounter for screening for other viral diseases: Secondary | ICD-10-CM | POA: Diagnosis not present

## 2016-01-01 DIAGNOSIS — E119 Type 2 diabetes mellitus without complications: Secondary | ICD-10-CM

## 2016-01-01 DIAGNOSIS — D509 Iron deficiency anemia, unspecified: Secondary | ICD-10-CM

## 2016-01-01 DIAGNOSIS — R000 Tachycardia, unspecified: Secondary | ICD-10-CM

## 2016-01-01 LAB — COMPREHENSIVE METABOLIC PANEL
ALBUMIN: 4.2 g/dL (ref 3.5–5.2)
ALT: 21 U/L (ref 0–35)
AST: 17 U/L (ref 0–37)
Alkaline Phosphatase: 80 U/L (ref 39–117)
BUN: 14 mg/dL (ref 6–23)
CALCIUM: 9.5 mg/dL (ref 8.4–10.5)
CO2: 30 mEq/L (ref 19–32)
Chloride: 100 mEq/L (ref 96–112)
Creatinine, Ser: 0.76 mg/dL (ref 0.40–1.20)
GFR: 81.7 mL/min (ref >60.00)
Glucose, Bld: 134 mg/dL — ABNORMAL HIGH (ref 70–99)
POTASSIUM: 4.3 meq/L (ref 3.5–5.1)
SODIUM: 137 meq/L (ref 135–145)
TOTAL PROTEIN: 6.9 g/dL (ref 6.0–8.3)
Total Bilirubin: 0.3 mg/dL (ref 0.2–1.2)

## 2016-01-01 LAB — LIPID PANEL
CHOLESTEROL: 172 mg/dL (ref 0–200)
HDL: 61.2 mg/dL (ref >39.00)
LDL Cholesterol: 78 mg/dL (ref 0–99)
NonHDL: 111.15
TRIGLYCERIDES: 168 mg/dL — AB (ref 0.0–149.0)
Total CHOL/HDL Ratio: 3
VLDL: 33.6 mg/dL (ref 0.0–40.0)

## 2016-01-01 LAB — FERRITIN: Ferritin: 4.8 ng/mL — ABNORMAL LOW (ref 10.0–291.0)

## 2016-01-01 LAB — CBC
HEMATOCRIT: 31.8 % — AB (ref 36.0–46.0)
HEMOGLOBIN: 10.2 g/dL — AB (ref 12.0–15.0)
MCHC: 32 g/dL (ref 30.0–36.0)
MCV: 74.5 fl — AB (ref 78.0–100.0)
PLATELETS: 466 10*3/uL — AB (ref 150.0–400.0)
RBC: 4.27 Mil/uL (ref 3.87–5.11)
RDW: 22.5 % — ABNORMAL HIGH (ref 11.5–15.5)
WBC: 9.2 10*3/uL (ref 4.0–10.5)

## 2016-01-01 NOTE — Assessment & Plan Note (Signed)
Tolerating medication. We'll continue Lipitor. We'll check lipid panel and CMP today.

## 2016-01-01 NOTE — Assessment & Plan Note (Signed)
Symptoms significantly improved. Suspect her fatigue and minimal shortness of breath are related to anemia. I did again discuss evaluation by cardiology if this continues though cardiac cause felt to be less likely given lack of chest pain and negative prior workup. Patient declines cardiac evaluation of this time. Vital signs are stable today. We'll continue to monitor her hemoglobin and iron. Recheck CBC and iron studies today. Given return precautions.

## 2016-01-01 NOTE — Progress Notes (Signed)
Patient ID: Tamara Summers, female   DOB: [birth year], 65 y.o.   MRN: [id number]  [name], MD Phone: [phone]  Tamara Summers is a 65 y.o. female who presents today for follow-up.  HYPERLIPIDEMIA Symptoms Chest pain on exertion:  No   Leg claudication:   No Medications: Compliance- taking Lipitor 40 mg daily Right upper quadrant pain- no  Muscle aches- no  DIABETES Disease Monitoring: Blood Sugar ranges-low of 62, high of 200, mostly in the 170s Polyuria/phagia/dipsia- no      ophthalmology- is due to see them in the next 1-2 months Medications: Compliance- taking Amaryl and metformin Hypoglycemic symptoms- minimal one time when her blood sugar was 62, she got shaky and dizzy, this improved with eating an egg sandwich and a banana  Anemia: Patient underwent EGD that revealed Barrett's esophagus and then 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 This was nonbleeding. They recommended continuing iron supplements and monitoring hemoglobin. If no improvement in hemoglobin with cauterization and treatment with iron they would consider capsule endoscopy. She is tolerating the iron well. No nausea or constipation. She notes her fatigue and shortness of breath are significantly improved and very minimal at this time. No chest pain. No blood in her stool. She does note occasional palpitations with this and notes her heart rate has gotten up to the 120s at home on their machine. Prior EKG from ED visit in December was reviewed and was normal sinus rhythm. Notes this does not occur very frequently. She is asymptomatic at this time with no chest pain, shortness breath, or palpitations.  PMH: Former smoker.   ROS see history of present illness  Objective  Physical Exam Filed Vitals:   01/01/16 0804  BP: 116/74  Pulse: 96  Temp: 98.3 F (36.8 C)    BP Readings from Last 3 Encounters:  01/01/16 116/74  12/21/15 138/88  11/29/15 124/72   Wt Readings from Last 3  Encounters:  01/01/16 143 lb (64.864 kg)  12/21/15 142 lb 12.8 oz (64.774 kg)  11/29/15 142 lb (64.411 kg)    Physical Exam  Constitutional: She is well-developed, well-nourished, and in no distress.  HENT:  Head: Normocephalic and atraumatic.  Right Ear: External ear normal.  Left Ear: External ear normal.  Mouth/Throat: Oropharynx is clear and moist. No oropharyngeal exudate.  Eyes: Conjunctivae are normal.  Neck: Neck supple.  Cardiovascular: Normal rate, regular rhythm and normal heart sounds.  Exam reveals no gallop and no friction rub.   No murmur heard. Pulmonary/Chest: Effort normal and breath sounds normal. No respiratory distress. She has no wheezes. She has no rales.  Lymphadenopathy:    She has no cervical adenopathy.  Neurological: She is alert. Gait normal.  Skin: Skin is warm and dry. She is not diaphoretic.     Assessment/Plan: Please see individual problem list.  Anemia Symptoms significantly improved. Suspect her fatigue and minimal shortness of breath are related to anemia. I did again discuss evaluation by cardiology if this continues though cardiac cause felt to be less likely given lack of chest pain and negative prior workup. Patient declines cardiac evaluation of this time. Vital signs are stable today. We'll continue to monitor her hemoglobin and iron. Recheck CBC and iron studies today. Given return precautions.  Diabetes mellitus type 2, controlled, without complications ([place]) Home glucose is mostly controlled. Did have mild elevation while on prednisone for recent upper respiratory infection. Advised that she should continue to monitor these in the next 2 weeks and call  with her results. If they remain in the 170s we will need to consider increasing her medications. Advised to monitor for hypoglycemia and if this recurs she is let us know. Given return precautions.  Hyperlipidemia Tolerating medication. We'll continue Lipitor. We'll check lipid panel and  CMP today.  Increased heart rate Patient does report continued intermittent heart rate elevations on their machine at home. Has been in the normal range here in the office. Had prior normal EKG. Suspected that this could be related to her anemia. We will recheck her CBC today. We will also check electrolytes. I again discussed cardiology referral though she opted against this at this time. She is asymptomatic at this time. She will bring her machine in to her next office visit to compare to our readings. She is given return precautions.    Orders Placed This Encounter  Procedures  . Lipid Profile  . CBC  . Iron and TIBC  . Ferritin  . Hepatitis C Antibody  . HIV antibody (with reflex)  . Comp Met (CMET)  . Hepatitis B Core Antibody, total  . Hepatitis B Surface AntiBODY  . Hepatitis B Surface AntiGEN    No orders of the defined types were placed in this encounter.    # Healthcare maintenance: We'll check hepatitis C antibody, HIV antibody, and hepatitis B serology   [name]

## 2016-01-01 NOTE — Progress Notes (Signed)
Pre visit review using our clinic review tool, if applicable. No additional management support is needed unless otherwise documented below in the visit note. 

## 2016-01-01 NOTE — Assessment & Plan Note (Signed)
Patient does report continued intermittent heart rate elevations on their machine at home. Has been in the normal range here in the office. Had prior normal EKG. Suspected that this could be related to her anemia. We will recheck her CBC today. We will also check electrolytes. I again discussed cardiology referral though she opted against this at this time. She is asymptomatic at this time. She will bring her machine in to her next office visit to compare to our readings. She is given return precautions.

## 2016-01-01 NOTE — Patient Instructions (Signed)
Nice to see you. Please continue to monitor blood sugars after coming off the steroids. If they're persistently over 200 please let us know. If he drops below 80 on multiple occasions please let us know as well. We will obtain some lab work today to follow-up on her chronic issues. If you develop chest pain, shortness of breath, palpitations, lightheadedness, fatigue, or any new or changing symptoms please seek medical attention.

## 2016-01-01 NOTE — Assessment & Plan Note (Signed)
Home glucose is mostly controlled. Did have mild elevation while on prednisone for recent upper respiratory infection. Advised that she should continue to monitor these in the next 2 weeks and call with her results. If they remain in the 170s we will need to consider increasing her medications. Advised to monitor for hypoglycemia and if this recurs she is let us know. Given return precautions.

## 2016-01-02 LAB — HIV ANTIBODY (ROUTINE TESTING W REFLEX): HIV: NONREACTIVE

## 2016-01-02 LAB — HEPATITIS C ANTIBODY: HCV Ab: NEGATIVE

## 2016-01-02 LAB — IRON AND TIBC
%SAT: 5 % — AB (ref 11–50)
Iron: 24 ug/dL — ABNORMAL LOW (ref 45–160)
TIBC: 498 ug/dL — AB (ref 250–450)
UIBC: 474 ug/dL — ABNORMAL HIGH (ref 125–400)

## 2016-01-02 LAB — HEPATITIS B SURFACE ANTIGEN: Hepatitis B Surface Ag: NEGATIVE

## 2016-01-02 LAB — HEPATITIS B SURFACE ANTIBODY,QUALITATIVE: Hep B S Ab: NEGATIVE

## 2016-01-02 LAB — HEPATITIS B CORE ANTIBODY, TOTAL: HEP B C TOTAL AB: NONREACTIVE

## 2016-01-29 ENCOUNTER — Ambulatory Visit (INDEPENDENT_AMBULATORY_CARE_PROVIDER_SITE_OTHER): Payer: 59 | Admitting: Family Medicine

## 2016-01-29 ENCOUNTER — Encounter: Payer: Self-pay | Admitting: Family Medicine

## 2016-01-29 VITALS — BP 132/84 | HR 99 | Temp 97.6°F | Wt 142.4 lb

## 2016-01-29 DIAGNOSIS — D649 Anemia, unspecified: Secondary | ICD-10-CM | POA: Diagnosis not present

## 2016-01-29 DIAGNOSIS — H539 Unspecified visual disturbance: Secondary | ICD-10-CM

## 2016-01-29 DIAGNOSIS — E119 Type 2 diabetes mellitus without complications: Secondary | ICD-10-CM

## 2016-01-29 DIAGNOSIS — R000 Tachycardia, unspecified: Secondary | ICD-10-CM | POA: Diagnosis not present

## 2016-01-29 DIAGNOSIS — R002 Palpitations: Secondary | ICD-10-CM | POA: Diagnosis not present

## 2016-01-29 LAB — MICROALBUMIN / CREATININE URINE RATIO
Creatinine,U: 70.4 mg/dL
MICROALB/CREAT RATIO: 1 mg/g (ref 0.0–30.0)

## 2016-01-29 LAB — HEMOGLOBIN A1C: HEMOGLOBIN A1C: 6.8 % — AB (ref 4.6–6.5)

## 2016-01-29 NOTE — Assessment & Plan Note (Signed)
Tolerating iron. Patient will continue this. Appears that she has labs scheduled for 2 months with GI. We'll continue to monitor.

## 2016-01-29 NOTE — Assessment & Plan Note (Signed)
Patient reports a pressure sensation behind right eye with possible vision change. This could be related to migraines as she has a history of this. Benign eye exam today. No sensation of pressure at this time. Neurologically intact. No signs of an acute glaucoma episode in a patient with no history of glaucoma. No corneal abnormalities noted on exam. She'll follow-up with her ophthalmologist this week. She is given return precautions.

## 2016-01-29 NOTE — Patient Instructions (Signed)
Nice to see you. We will check an A1c and urine urine for protein today given her history of diabetes. You need to see your eye doctor sometime this week. We will refer you to cardiology for further evaluation of your palpitations. These continue your iron supplements. It appears that her GI physician would like to recheck her labs in 2 months. If you develop chest pain, palpitations, shortness of breath, persistent vision changes, numbness, weakness, headaches, eye pain, eye redness, or any new or changing symptoms please seek medical attention.

## 2016-01-29 NOTE — Progress Notes (Signed)
Patient ID: Tamara Summers, female   DOB: March 07, 1951, 65 y.o.   MRN: [id number]  [name], MD Phone: [phone]  Tamara Summers is a 65 y.o. female who presents today for follow-up.  Palpitations: Patient notes these have persisted. Pulse has gone up from anywhere from the 110s to 120. Was 118 this morning. Occasionally she can feel a flutter. Can occur when she is just sitting there or when she is active. She has no chest pain or shortness of breath with this. Occasionally if she has been walking for an extended period of time she does get winded. In the past we discussed cardiology evaluation though she opted against this.  Anemia: Patient is taking iron 3 times daily. Tolerating the medication well. No nausea or constipation. Is taking this with vitamin C.  DIABETES Disease Monitoring: Blood Sugar ranges-170-190 Polyuria/phagia/dipsia- no      Visual problems- yes, see below Medications: Compliance- taking metformin and Amaryl Hypoglycemic symptoms- yes 1-2 times a month, get shaky and has to eat something.  Patient initially notes possibly her vision is changed. Occasionally she notes a pressure sensation behind her right eye. Notes it feels as though it is going to be a migraine that doesn't quite get to that point. Notes some photophobia with this. No eye redness. Notes it lasts briefly and goes away on its own. Last time was yesterday. Has not seen an eye doctor in some time. No numbness or weakness with this. No vision symptoms at this time.  PMH: Former smoker   ROS see history of present illness  Objective  Physical Exam Filed Vitals:   01/29/16 0808  BP: 132/84  Pulse: 99  Temp: 97.6 F (36.4 C)    BP Readings from Last 3 Encounters:  01/29/16 132/84  01/01/16 116/74  12/21/15 138/88   Wt Readings from Last 3 Encounters:  01/29/16 142 lb 6 oz (64.581 kg)  01/01/16 143 lb (64.864 kg)  12/21/15 142 lb 12.8 oz (64.774 kg)    Physical Exam  Constitutional:  She is well-developed, well-nourished, and in no distress.  HENT:  Head: Normocephalic and atraumatic.  Right Ear: External ear normal.  Left Ear: External ear normal.  Mouth/Throat: Oropharynx is clear and moist. No oropharyngeal exudate.  Eyes: Conjunctivae and EOM are normal. Pupils are equal, round, and reactive to light.  Bilateral eyes with no apparent abnormalities on gross visualization, cornea appears normal on gross visualization  Neck: Neck supple.  Cardiovascular: Normal rate, regular rhythm and normal heart sounds.  Exam reveals no gallop and no friction rub.   No murmur heard. Pulmonary/Chest: Effort normal and breath sounds normal. No respiratory distress. She has no wheezes. She has no rales.  Musculoskeletal: She exhibits no edema.  Lymphadenopathy:    She has no cervical adenopathy.  Neurological: She is alert.  CN 2-12 intact, 5/5 strength in bilateral biceps, triceps, grip, quads, hamstrings, plantar and dorsiflexion, sensation to light touch intact in bilateral UE and LE, normal gait, 2+ patellar reflexes  Skin: Skin is warm and dry. She is not diaphoretic.   EKG: Normal sinus rhythm, rate 88, RSR prime in V1, no ST or T-wave changes  Assessment/Plan: Please see individual problem list.  Anemia Tolerating iron. Patient will continue this. Appears that she has labs scheduled for 2 months with GI. We'll continue to monitor.  Diabetes mellitus type 2, controlled, without complications ([place]) Home CBGs uncontrolled. Patient has been on 1500 mg of metformin and takes Amaryl once daily. We will check  an A1c today.  Increased heart rate Patient with persistent heart rate elevations and palpitations at home. Could be related to her anemia though given persistence repeat EKG was obtained and was reassuring. Also advised referral to cardiology for evaluation. She is asymptomatic at this time. She's given return precautions.  Vision changes Patient reports a pressure  sensation behind right eye with possible vision change. This could be related to migraines as she has a history of this. Benign eye exam today. No sensation of pressure at this time. Neurologically intact. No signs of an acute glaucoma episode in a patient with no history of glaucoma. No corneal abnormalities noted on exam. She'll follow-up with her ophthalmologist this week. She is given return precautions.    Orders Placed This Encounter  Procedures  . HgB A1c  . Urine Microalbumin w/creat. ratio  . Ambulatory referral to Cardiology    Referral Priority:  Routine    Referral Type:  Consultation    Referral Reason:  Specialty Services Required    Requested Specialty:  Cardiology    Number of Visits Requested:  1  . EKG 12-Lead    [name], MD [place]

## 2016-01-29 NOTE — Assessment & Plan Note (Addendum)
Patient with persistent heart rate elevations and palpitations at home. Could be related to her anemia though given persistence repeat EKG was obtained and was reassuring. Also advised referral to cardiology for evaluation. She is asymptomatic at this time. She's given return precautions.

## 2016-01-29 NOTE — Progress Notes (Signed)
Pre visit review using our clinic review tool, if applicable. No additional management support is needed unless otherwise documented below in the visit note. 

## 2016-01-29 NOTE — Assessment & Plan Note (Signed)
Home CBGs uncontrolled. Patient has been on 1500 mg of metformin and takes Amaryl once daily. We will check an A1c today.

## 2016-03-11 ENCOUNTER — Encounter: Payer: Self-pay | Admitting: Family Medicine

## 2016-03-11 ENCOUNTER — Ambulatory Visit (INDEPENDENT_AMBULATORY_CARE_PROVIDER_SITE_OTHER): Payer: 59 | Admitting: Family Medicine

## 2016-03-11 VITALS — BP 128/80 | HR 66 | Temp 98.2°F

## 2016-03-11 DIAGNOSIS — N390 Urinary tract infection, site not specified: Secondary | ICD-10-CM

## 2016-03-11 DIAGNOSIS — R1032 Left lower quadrant pain: Secondary | ICD-10-CM | POA: Insufficient documentation

## 2016-03-11 LAB — POCT URINALYSIS DIPSTICK
BILIRUBIN UA: NEGATIVE
Glucose, UA: NEGATIVE
Ketones, UA: NEGATIVE
Leukocytes, UA: NEGATIVE
NITRITE UA: NEGATIVE
PH UA: 5.5
Protein, UA: NEGATIVE
Spec Grav, UA: 1.015
UROBILINOGEN UA: 0.2

## 2016-03-11 MED ORDER — METRONIDAZOLE 500 MG PO TABS: 500.0000 mg | ORAL_TABLET | Freq: Three times a day (TID) | ORAL | Status: DC

## 2016-03-11 MED ORDER — CIPROFLOXACIN HCL 500 MG PO TABS: 500.0000 mg | ORAL_TABLET | Freq: Two times a day (BID) | ORAL | Status: DC

## 2016-03-11 NOTE — Patient Instructions (Signed)
Nice to see you. We are going to treat you for diverticulitis and UTI. We will place you on ciprofloxacin and Flagyl. We'll send your urine for culture to confirm whether or not you have an infection. We will check some lab work to evaluate for markers of infection and inflammation. You need to stick to a liquid diet until your abdominal discomfort improves. If you develop abdominal pain, nausea, vomiting, blood in her stool, fevers, inability to tolerate liquid intake, or any new or changing symptoms please seek medical attention.

## 2016-03-11 NOTE — Assessment & Plan Note (Signed)
Patient with left lower quadrant abdominal discomfort. Has urinary symptoms consistent with UTI. UA with trace amount of blood. Also has a history of diverticulitis. No peritoneal signs. Vital signs are stable. Good bowel sounds. Given urinary symptoms and history of diverticulitis we will cover for both of these causes. Place on ciprofloxacin and Flagyl. We'll send urine for culture and microscopy. Will obtain CBC, sedimentation rate, and CRP to evaluate her left lower quadrant discomfort. Discussed liquid diet. Discussed imaging though patient opted to hold off on this at this time. Patient will monitor. Follow-up in 3 days. Given return precautions.

## 2016-03-11 NOTE — Progress Notes (Signed)
Patient ID: Tamara Summers, female   DOB: [birth year], 65 y.o.   MRN: [id number]  [name], MD Phone: [phone]  Tamara Summers is a 65 y.o. female who presents today for same-day visit.  Patient had several days of urinary frequency and urgency. Also notes some dysuria. no vaginal discharge or itching. She notes some left lower quadrant abdominal discomfort. States it is a burning discomfort. She has a history of diverticulitis. Last episode was 4 years ago. She notes normal bowel movements typically though has intermittent diarrhea. No constipation. No blood in her stool. No nausea or vomiting. No fevers. She is tolerating oral intake without worsening of her stomach discomfort.  PMH: Former smoker. Has a history of diverticulitis. ROS see history of present illness  Objective  Physical Exam Filed Vitals:   03/11/16 1115  BP: 128/80  Pulse: 66  Temp: 98.2 F (36.8 C)    BP Readings from Last 3 Encounters:  03/11/16 128/80  01/29/16 132/84  01/01/16 116/74   Wt Readings from Last 3 Encounters:  01/29/16 142 lb 6 oz (64.581 kg)  01/01/16 143 lb (64.864 kg)  12/21/15 142 lb 12.8 oz (64.774 kg)    Physical Exam  Constitutional: She is well-developed, well-nourished, and in no distress.  HENT:  Head: Normocephalic and atraumatic.  Cardiovascular: Normal rate, regular rhythm and normal heart sounds.   Pulmonary/Chest: Effort normal and breath sounds normal.  Abdominal: Soft. Bowel sounds are normal. She exhibits no distension. There is tenderness (mild tenderness left lower quadrant, no other tenderness). There is no rebound and no guarding.  Musculoskeletal: She exhibits no edema.  Neurological: She is alert. Gait normal.  Skin: Skin is warm and dry. She is not diaphoretic.     Assessment/Plan: Please see individual problem list.  Abdominal pain, left lower quadrant Patient with left lower quadrant abdominal discomfort. Has urinary symptoms consistent with UTI. UA  with trace amount of blood. Also has a history of diverticulitis. No peritoneal signs. Vital signs are stable. Good bowel sounds. Given urinary symptoms and history of diverticulitis we will cover for both of these causes. Place on ciprofloxacin and Flagyl. We'll send urine for culture and microscopy. Will obtain CBC, sedimentation rate, and CRP to evaluate her left lower quadrant discomfort. Discussed liquid diet. Discussed imaging though patient opted to hold off on this at this time. Patient will monitor. Follow-up in 3 days. Given return precautions.    Orders Placed This Encounter  Procedures  . Urine Culture  . Urine Microscopic Only  . CBC  . Sed Rate (ESR)  . C-reactive protein  . POCT urinalysis dipstick    [name], MD [place]

## 2016-03-12 LAB — URINALYSIS, MICROSCOPIC ONLY: CASTS: NONE SEEN /LPF

## 2016-03-12 LAB — SEDIMENTATION RATE: SED RATE: 7 mm/h (ref 0–40)

## 2016-03-12 LAB — CBC WITH DIFFERENTIAL/PLATELET
Basophils Absolute: 0.1 10*3/uL (ref 0.0–0.2)
Basos: 1 %
EOS (ABSOLUTE): 0.1 10*3/uL (ref 0.0–0.4)
Eos: 2 %
Hematocrit: 39.4 % (ref 34.0–46.6)
Hemoglobin: 13 g/dL (ref 11.1–15.9)
IMMATURE GRANULOCYTES: 0 %
Immature Grans (Abs): 0 10*3/uL (ref 0.0–0.1)
Lymphocytes Absolute: 2.8 10*3/uL (ref 0.7–3.1)
Lymphs: 42 %
MCH: 28.1 pg (ref 26.6–33.0)
MCHC: 33 g/dL (ref 31.5–35.7)
MCV: 85 fL (ref 79–97)
MONOS ABS: 0.4 10*3/uL (ref 0.1–0.9)
Monocytes: 6 %
NEUTROS PCT: 49 %
Neutrophils Absolute: 3.3 10*3/uL (ref 1.4–7.0)
PLATELETS: 321 10*3/uL (ref 150–379)
RBC: 4.63 x10E6/uL (ref 3.77–5.28)
RDW: 19.8 % — AB (ref 12.3–15.4)
WBC: 6.7 10*3/uL (ref 3.4–10.8)

## 2016-03-12 LAB — C-REACTIVE PROTEIN: CRP: 10.9 mg/L — ABNORMAL HIGH (ref 0.0–4.9)

## 2016-03-13 LAB — URINE CULTURE: Organism ID, Bacteria: NO GROWTH

## 2016-03-14 ENCOUNTER — Ambulatory Visit (INDEPENDENT_AMBULATORY_CARE_PROVIDER_SITE_OTHER): Payer: 59 | Admitting: Family Medicine

## 2016-03-14 ENCOUNTER — Encounter: Payer: Self-pay | Admitting: Family Medicine

## 2016-03-14 VITALS — BP 128/84 | HR 81 | Temp 98.1°F | Ht 64.0 in | Wt 138.8 lb

## 2016-03-14 DIAGNOSIS — K121 Other forms of stomatitis: Secondary | ICD-10-CM | POA: Diagnosis not present

## 2016-03-14 DIAGNOSIS — R1032 Left lower quadrant pain: Secondary | ICD-10-CM

## 2016-03-14 NOTE — Assessment & Plan Note (Addendum)
Patient with small ulceration on the left upper palate. No appearance of infection. On review of antibiotics on up-to-date and does not appear that ulcerations occur as a side effect. Lesion is not consistent with SJS. She has no other rash. Suspect aphthous ulcer. Offered topical steroid though she declined. She'll use saltwater gargles. She'll continue to monitor. If worsens or changes she will let us know immediately. If does not improve in the next 1-2 weeks she will let us know. Given return precautions.

## 2016-03-14 NOTE — Progress Notes (Signed)
Patient ID: Tamara Summers, female   DOB: 06-14-1951, 65 y.o.   MRN: [id number]  [name], MD Phone: [phone]  Tamara Summers is a 65 y.o. female who presents today for follow-up.  Patient was seen several days ago for left lower quadrant abdominal discomfort and treated for diverticulitis. Had mildly elevated CRP. She had a negative urine culture. She was placed on ciprofloxacin and Flagyl. She notes her abdominal pain is resolved. She does note some intermittent diarrhea. Minimal nausea. No vomiting. She's been adhering to a liquid diet. No fevers. She feels better overall. She notes the diarrhea was present prior to the antibiotics. She does note on a couple of occasions she has had blood sugars in the 60s with the liquid diet. She   would drink a Coke and this would improve. She additionally notes a small area of soreness in her mouth on her upper palate. This feels raw. Started this morning. History of ulcerations previously. No other rash elsewhere. No other mouth involvement.   ROS see history of present illness  Objective  Physical Exam Filed Vitals:   03/14/16 1307  BP: 128/84  Pulse: 81  Temp: 98.1 F (36.7 C)    BP Readings from Last 3 Encounters:  03/14/16 128/84  03/11/16 128/80  01/29/16 132/84   Wt Readings from Last 3 Encounters:  03/14/16 138 lb 12.8 oz (62.959 kg)  01/29/16 142 lb 6 oz (64.581 kg)  01/01/16 143 lb (64.864 kg)    Physical Exam  Constitutional: She is well-developed, well-nourished, and in no distress.  HENT:  Head: Normocephalic and atraumatic.  Left Ear: External ear normal.  Mouth/Throat: No oropharyngeal exudate.  Left upper palate with small half a centimeter shallow appearing ulcer, is mildly tender to palpation, no tooth tenderness, no surrounding erythema  Eyes: Conjunctivae are normal. Pupils are equal, round, and reactive to light.  Cardiovascular: Normal rate, regular rhythm and normal heart sounds.   Pulmonary/Chest:  Effort normal and breath sounds normal.  Abdominal: Soft. Bowel sounds are normal. She exhibits no distension. There is no tenderness. There is no rebound and no guarding.  Neurological: She is alert. Gait normal.  Skin: Skin is warm and dry. She is not diaphoretic.     Assessment/Plan: Please see individual problem list.  Abdominal pain, left lower quadrant Suspect diverticulitis as cause given improvement with ciprofloxacin and Flagyl and mildly elevated CRP. Urine culture was negative. She is improved overall. Benign abdominal exam today. She does still have some diarrhea. Advised to take a probiotic or eat yogurt with this. She'll finish the courses of ciprofloxacin and Flagyl. She will start bland foods and advance her diet as tolerated. She'll monitor blood sugars. If they continue to drop below 80 she will hold Amaryl until she is back to her normal diet. She will continue to monitor. She's given return precautions.  Mouth ulcer Patient with small ulceration on the left upper palate. No appearance of infection. On review of antibiotics on up-to-date and does not appear that ulcerations occur as a side effect. Lesion is not consistent with SJS. She has no other rash. Suspect aphthous ulcer. Offered topical steroid though she declined. She'll use saltwater gargles. She'll continue to monitor. If worsens or changes she will let us know immediately. If does not improve in the next 1-2 weeks she will let us know. Given return precautions.    [name], MD [place]

## 2016-03-14 NOTE — Patient Instructions (Signed)
Nice to see you. I'm glad you are feeling better. Please continue to monitor the sore in your mouth. If this worsens or you develop more rash in your mouth please let us know immediately. You can advance her diet as tolerated starting with bland foods. Please continue to monitor blood sugar. If it continues to drop below 80 you can hold your Amaryl and let us know. If you develop abdominal pain, fevers, worsening of the sore in your mouth, blood in her stool, nausea, vomiting, low blood sugars, or any new or changing symptoms please seek medical attention.

## 2016-03-14 NOTE — Assessment & Plan Note (Signed)
Suspect diverticulitis as cause given improvement with ciprofloxacin and Flagyl and mildly elevated CRP. Urine culture was negative. She is improved overall. Benign abdominal exam today. She does still have some diarrhea. Advised to take a probiotic or eat yogurt with this. She'll finish the courses of ciprofloxacin and Flagyl. She will start bland foods and advance her diet as tolerated. She'll monitor blood sugars. If they continue to drop below 80 she will hold Amaryl until she is back to her normal diet. She will continue to monitor. She's given return precautions.

## 2016-03-14 NOTE — Progress Notes (Signed)
Pre visit review using our clinic review tool, if applicable. No additional management support is needed unless otherwise documented below in the visit note. 

## 2016-04-11 ENCOUNTER — Telehealth: Payer: Self-pay | Admitting: *Deleted

## 2016-04-11 ENCOUNTER — Emergency Department: Payer: 59

## 2016-04-11 ENCOUNTER — Encounter: Payer: Self-pay | Admitting: Emergency Medicine

## 2016-04-11 ENCOUNTER — Observation Stay: Payer: 59

## 2016-04-11 ENCOUNTER — Observation Stay
Admission: EM | Admit: 2016-04-11 | Discharge: 2016-04-12 | Disposition: A | Discharge status: Home / Self Care | Payer: 59 | Attending: Internal Medicine | Admitting: Internal Medicine

## 2016-04-11 DIAGNOSIS — R531 Weakness: Principal | ICD-10-CM

## 2016-04-11 DIAGNOSIS — Z90710 Acquired absence of both cervix and uterus: Secondary | ICD-10-CM | POA: Insufficient documentation

## 2016-04-11 DIAGNOSIS — Z882 Allergy status to sulfonamides status: Secondary | ICD-10-CM | POA: Insufficient documentation

## 2016-04-11 DIAGNOSIS — Z8673 Personal history of transient ischemic attack (TIA), and cerebral infarction without residual deficits: Secondary | ICD-10-CM | POA: Diagnosis not present

## 2016-04-11 DIAGNOSIS — I10 Essential (primary) hypertension: Secondary | ICD-10-CM | POA: Diagnosis not present

## 2016-04-11 DIAGNOSIS — I639 Cerebral infarction, unspecified: Secondary | ICD-10-CM

## 2016-04-11 DIAGNOSIS — Z880 Allergy status to penicillin: Secondary | ICD-10-CM | POA: Insufficient documentation

## 2016-04-11 DIAGNOSIS — R51 Headache: Secondary | ICD-10-CM | POA: Insufficient documentation

## 2016-04-11 DIAGNOSIS — E1165 Type 2 diabetes mellitus with hyperglycemia: Secondary | ICD-10-CM | POA: Insufficient documentation

## 2016-04-11 DIAGNOSIS — Z8619 Personal history of other infectious and parasitic diseases: Secondary | ICD-10-CM | POA: Diagnosis not present

## 2016-04-11 DIAGNOSIS — K219 Gastro-esophageal reflux disease without esophagitis: Secondary | ICD-10-CM | POA: Insufficient documentation

## 2016-04-11 DIAGNOSIS — Z79899 Other long term (current) drug therapy: Secondary | ICD-10-CM | POA: Insufficient documentation

## 2016-04-11 DIAGNOSIS — R339 Retention of urine, unspecified: Secondary | ICD-10-CM | POA: Insufficient documentation

## 2016-04-11 DIAGNOSIS — J45909 Unspecified asthma, uncomplicated: Secondary | ICD-10-CM | POA: Insufficient documentation

## 2016-04-11 DIAGNOSIS — Z87891 Personal history of nicotine dependence: Secondary | ICD-10-CM | POA: Diagnosis not present

## 2016-04-11 DIAGNOSIS — E785 Hyperlipidemia, unspecified: Secondary | ICD-10-CM | POA: Insufficient documentation

## 2016-04-11 DIAGNOSIS — G459 Transient cerebral ischemic attack, unspecified: Secondary | ICD-10-CM

## 2016-04-11 DIAGNOSIS — Z7984 Long term (current) use of oral hypoglycemic drugs: Secondary | ICD-10-CM | POA: Insufficient documentation

## 2016-04-11 DIAGNOSIS — R42 Dizziness and giddiness: Secondary | ICD-10-CM | POA: Diagnosis not present

## 2016-04-11 DIAGNOSIS — Z791 Long term (current) use of non-steroidal anti-inflammatories (NSAID): Secondary | ICD-10-CM | POA: Diagnosis not present

## 2016-04-11 DIAGNOSIS — I771 Stricture of artery: Secondary | ICD-10-CM | POA: Insufficient documentation

## 2016-04-11 DIAGNOSIS — Z9049 Acquired absence of other specified parts of digestive tract: Secondary | ICD-10-CM | POA: Diagnosis not present

## 2016-04-11 HISTORY — DX: Transient cerebral ischemic attack, unspecified: G45.9

## 2016-04-11 LAB — COMPREHENSIVE METABOLIC PANEL
ALBUMIN: 4.2 g/dL (ref 3.5–5.0)
ALT: 31 U/L (ref 14–54)
ANION GAP: 13 (ref 5–15)
AST: 30 U/L (ref 15–41)
Alkaline Phosphatase: 95 U/L (ref 38–126)
BILIRUBIN TOTAL: 0.6 mg/dL (ref 0.3–1.2)
BUN: 17 mg/dL (ref 6–20)
CHLORIDE: 102 mmol/L (ref 101–111)
CO2: 23 mmol/L (ref 22–32)
Calcium: 9.5 mg/dL (ref 8.9–10.3)
Creatinine, Ser: 0.71 mg/dL (ref 0.44–1.00)
GFR calc Af Amer: >60 mL/min (ref >60)
GFR calc non Af Amer: >60 mL/min (ref >60)
GLUCOSE: 109 mg/dL — AB (ref 65–99)
POTASSIUM: 3.7 mmol/L (ref 3.5–5.1)
SODIUM: 138 mmol/L (ref 135–145)
TOTAL PROTEIN: 7.2 g/dL (ref 6.5–8.1)

## 2016-04-11 LAB — LIPID PANEL
CHOL/HDL RATIO: 2.8 ratio
Cholesterol: 149 mg/dL (ref 0–200)
HDL: 53 mg/dL (ref >40)
LDL CALC: 53 mg/dL (ref 0–99)
TRIGLYCERIDES: 217 mg/dL — AB (ref <150)
VLDL: 43 mg/dL — AB (ref 0–40)

## 2016-04-11 LAB — DIFFERENTIAL
BASOS ABS: 0 10*3/uL (ref 0–0.1)
BASOS PCT: 0 %
EOS ABS: 0.1 10*3/uL (ref 0–0.7)
EOS PCT: 2 %
LYMPHS ABS: 3 10*3/uL (ref 1.0–3.6)
Lymphocytes Relative: 41 %
Monocytes Absolute: 0.5 10*3/uL (ref 0.2–0.9)
Monocytes Relative: 7 %
NEUTROS PCT: 50 %
Neutro Abs: 3.6 10*3/uL (ref 1.4–6.5)

## 2016-04-11 LAB — CBC
HCT: 38.3 % (ref 35.0–47.0)
HEMOGLOBIN: 13 g/dL (ref 12.0–16.0)
MCH: 29.3 pg (ref 26.0–34.0)
MCHC: 33.8 g/dL (ref 32.0–36.0)
MCV: 86.6 fL (ref 80.0–100.0)
PLATELETS: 261 10*3/uL (ref 150–440)
RBC: 4.43 MIL/uL (ref 3.80–5.20)
RDW: 16.6 % — AB (ref 11.5–14.5)
WBC: 7.3 10*3/uL (ref 3.6–11.0)

## 2016-04-11 LAB — GLUCOSE, CAPILLARY
Glucose-Capillary: 119 mg/dL — ABNORMAL HIGH (ref 65–99)
Glucose-Capillary: 97 mg/dL (ref 65–99)
Glucose-Capillary: 250 mg/dL — ABNORMAL HIGH (ref 65–99)

## 2016-04-11 LAB — APTT: APTT: 26 s (ref 24–36)

## 2016-04-11 LAB — PROTIME-INR
INR: 0.93
PROTHROMBIN TIME: 12.7 s (ref 11.4–15.0)

## 2016-04-11 LAB — TROPONIN I: Troponin I: <0.03 ng/mL (ref <0.031)

## 2016-04-11 MED ORDER — PANTOPRAZOLE SODIUM 40 MG PO TBEC
40.0000 mg | DELAYED_RELEASE_TABLET | Freq: Every day | ORAL | Status: DC
  Administered 2016-04-12: 09:00:00 40 mg via ORAL
  Filled 2016-04-11: qty 1

## 2016-04-11 MED ORDER — METOPROLOL SUCCINATE ER 25 MG PO TB24
25.0000 mg | ORAL_TABLET | Freq: Every day | ORAL | Status: DC
  Administered 2016-04-12: 09:00:00 25 mg via ORAL
  Filled 2016-04-11: qty 1

## 2016-04-11 MED ORDER — METFORMIN HCL ER 500 MG PO TB24
1500.0000 mg | ORAL_TABLET | Freq: Every day | ORAL | Status: DC
  Administered 2016-04-12: 09:00:00 1500 mg via ORAL
  Filled 2016-04-11: qty 3

## 2016-04-11 MED ORDER — INSULIN ASPART 100 UNIT/ML ~~LOC~~ SOLN
0.0000 [IU] | Freq: Three times a day (TID) | SUBCUTANEOUS | Status: DC
  Administered 2016-04-12: 5 [IU] via SUBCUTANEOUS
  Administered 2016-04-12: 1 [IU] via SUBCUTANEOUS
  Filled 2016-04-11: qty 5
  Filled 2016-04-11: qty 1

## 2016-04-11 MED ORDER — GLIMEPIRIDE 2 MG PO TABS
2.0000 mg | ORAL_TABLET | Freq: Every day | ORAL | Status: DC
  Administered 2016-04-12: 2 mg via ORAL
  Filled 2016-04-11: qty 1

## 2016-04-11 MED ORDER — CLOPIDOGREL BISULFATE 75 MG PO TABS
75.0000 mg | ORAL_TABLET | Freq: Every day | ORAL | Status: DC
  Administered 2016-04-11 – 2016-04-12 (×2): 75 mg via ORAL
  Filled 2016-04-11 (×2): qty 1

## 2016-04-11 MED ORDER — HEPARIN SODIUM (PORCINE) 5000 UNIT/ML IJ SOLN
5000.0000 [IU] | Freq: Three times a day (TID) | INTRAMUSCULAR | Status: DC
  Administered 2016-04-11 – 2016-04-12 (×2): 5000 [IU] via SUBCUTANEOUS
  Filled 2016-04-11 (×2): qty 1

## 2016-04-11 MED ORDER — ESCITALOPRAM OXALATE 10 MG PO TABS
10.0000 mg | ORAL_TABLET | Freq: Every day | ORAL | Status: DC
  Administered 2016-04-12: 09:00:00 10 mg via ORAL
  Filled 2016-04-11 (×2): qty 1

## 2016-04-11 MED ORDER — ALBUTEROL SULFATE HFA 108 (90 BASE) MCG/ACT IN AERS: 2.0000 | INHALATION_SPRAY | Freq: Four times a day (QID) | RESPIRATORY_TRACT | Status: DC | PRN

## 2016-04-11 MED ORDER — ALBUTEROL SULFATE (2.5 MG/3ML) 0.083% IN NEBU: 2.5000 mg | INHALATION_SOLUTION | Freq: Four times a day (QID) | RESPIRATORY_TRACT | Status: DC | PRN

## 2016-04-11 MED ORDER — ASPIRIN 81 MG PO CHEW
324.0000 mg | CHEWABLE_TABLET | Freq: Once | ORAL | Status: AC
  Administered 2016-04-11: 324 mg via ORAL
  Filled 2016-04-11: qty 4

## 2016-04-11 MED ORDER — IOPAMIDOL (ISOVUE-370) INJECTION 76%
75.0000 mL | Freq: Once | INTRAVENOUS | Status: AC | PRN
  Administered 2016-04-11: 75 mL via INTRAVENOUS

## 2016-04-11 MED ORDER — IBUPROFEN 400 MG PO TABS
400.0000 mg | ORAL_TABLET | Freq: Four times a day (QID) | ORAL | Status: DC | PRN
  Administered 2016-04-12: 05:00:00 400 mg via ORAL
  Filled 2016-04-11: qty 1

## 2016-04-11 MED ORDER — SODIUM CHLORIDE 0.9% FLUSH
3.0000 mL | Freq: Two times a day (BID) | INTRAVENOUS | Status: DC
  Administered 2016-04-11 – 2016-04-12 (×2): 3 mL via INTRAVENOUS

## 2016-04-11 MED ORDER — ATORVASTATIN CALCIUM 20 MG PO TABS
40.0000 mg | ORAL_TABLET | Freq: Every day | ORAL | Status: DC
  Administered 2016-04-12: 40 mg via ORAL
  Filled 2016-04-11: qty 2

## 2016-04-11 NOTE — ED Provider Notes (Signed)
[facility] Emergency Department Provider Note  ____________________________________________  Time seen: Approximately 3:34 PM  I have reviewed the triage vital signs and the nursing notes.   HISTORY  Chief Complaint Extremity Weakness    HPI Tamara Summers is a 65 y.o. female who presents for evaluation of headache, dizziness, hyperglycemia, and left arm and left leg.  She reports that she awoke about 4 AM (almost 12 hours ago) with a severe headache and feeling somewhat dizzy.  She checked her blood sugar and it was elevated in the mid 200s which is very high for her.  Gradually the headache improved over timebut she continued to feel "off" during the day today.  She reports that she was a little "wobbly" when she was walking although she was not grossly off balance.  Her blood sugar remained elevated, however, so she went to her primary care doctor at [facility] clinic and they sent her directly over to the emergency department.  Reportedly they noted at triage that her left arm and left leg seemed to be weak and that she was not able to hold her left arm up.  She was not aware of this issue prior to it being pointed out to her.  She is non-insulin-dependent and also takes cholesterol medications.  She does not take any blood thinners or antiplatelet agents. She reports that her symptoms have improved and that currently there are mild.  She feels a little bit off balance still but better than before.  Her headache has essentially resolved.  Nothing in particular made them better or worse.  She denies nausea, vomiting, chest pain, shortness of breath, abdominal pain, dysuria.     Past Medical History  Diagnosis Date  . Hypertension   . Diabetes mellitus without complication ([place])   . Asthma   . Chickenpox   . Diverticulitis   . Headache   . GERD (gastroesophageal reflux disease)   . Allergic rhinitis   . Hyperlipidemia   . History of blood transfusion   .  Urinary retention   . TIA (transient ischemic attack)     Patient Active Problem List   Diagnosis Date Noted  . Mouth ulcer 03/14/2016  . Abdominal pain, left lower quadrant 03/11/2016  . Vision changes 01/29/2016  . Hyperlipidemia 01/01/2016  . Acute bronchitis 12/21/2015  . Essential hypertension 11/29/2015  . Anemia 10/29/2015  . Shortness of breath 10/29/2015  . Increased heart rate 10/29/2015  . Diabetes mellitus type 2, controlled, without complications ([place]) 0000000    Past Surgical History  Procedure Laterality Date  . Cholecystectomy  1988  . Abdominal hysterectomy  1987    Current Outpatient Rx  Name  Route  Sig  Dispense  Refill  . albuterol (PROVENTIL HFA;VENTOLIN HFA) 108 (90 BASE) MCG/ACT inhaler   Inhalation   Inhale 2 puffs into the lungs every 6 (six) hours as needed for wheezing or shortness of breath.   1 Inhaler   0   . atorvastatin (LIPITOR) 40 MG tablet   Oral   Take by mouth.         . benzonatate (TESSALON) 200 MG capsule   Oral   Take 1 capsule (200 mg total) by mouth 3 (three) times daily as needed for cough.   20 capsule   0   . Cholecalciferol (D 2000) 2000 UNITS TABS   Oral   Take by mouth.         . ciprofloxacin (CIPRO) 500 MG tablet  Oral   Take 1 tablet (500 mg total) by mouth 2 (two) times daily.   14 tablet   0   . dexlansoprazole (DEXILANT) 60 MG capsule      Take 1 capsule by mouth  once a day         . escitalopram (LEXAPRO) 10 MG tablet      Take 1 tablet by mouth once a day         . glimepiride (AMARYL) 2 MG tablet   Oral   Take by mouth.         . hydrochlorothiazide (HYDRODIURIL) 12.5 MG tablet   Oral   Take 1 tablet (12.5 mg total) by mouth daily.   90 tablet   3   . metFORMIN (GLUCOPHAGE-XR) 500 MG 24 hr tablet   Oral   Take 3 tablets (1,500 mg total) by mouth daily with breakfast.   270 tablet   3   . metoprolol succinate (TOPROL-XL) 25 MG 24 hr tablet   Oral   Take 25 mg by mouth  daily.         . metroNIDAZOLE (FLAGYL) 500 MG tablet   Oral   Take 1 tablet (500 mg total) by mouth 3 (three) times daily.   21 tablet   0   . tolterodine (DETROL LA) 2 MG 24 hr capsule   Oral   Take by mouth. Reported on 01/29/2016           Allergies Penicillins and Sulfa antibiotics  Family History  Problem Relation Age of Onset  . Hyperlipidemia      Parent  . Heart disease      Parent  . Hypertension      Parent  . Stroke      Grandparent  . Mental illness Sister     Social History Social History  Substance Use Topics  . Smoking status: Former Research scientist (life sciences)  . Smokeless tobacco: None  . Alcohol Use: 1.2 - 1.8 oz/week    2-3 Standard drinks or equivalent per week    Review of Systems Constitutional: No fever/chills Eyes: No visual changes. ENT: No sore throat. Cardiovascular: Denies chest pain. Respiratory: Denies shortness of breath. Gastrointestinal: No abdominal pain.  No nausea, no vomiting.  No diarrhea.  No constipation. Genitourinary: Negative for dysuria. Musculoskeletal: Negative for back pain. Skin: Negative for rash. Neurological: Left arm and left leg weakness.  Acute onset headache about 12 hours ago which has improved.  Feels "off balance and wobbily".   10-point ROS otherwise negative.  ____________________________________________   PHYSICAL EXAM:  VITAL SIGNS: ED Triage Vitals  Enc Vitals Group     BP 04/11/16 1418 141/82 mmHg     Pulse Rate 04/11/16 1418 91     Resp 04/11/16 1418 18     Temp 04/11/16 1418 98.4 F (36.9 C)     Temp src --      SpO2 04/11/16 1418 96 %     Weight 04/11/16 1418 140 lb (63.504 kg)     Height 04/11/16 1418 5\' 3"  (1.6 m)     Head Cir --      Peak Flow --      Pain Score --      Pain Loc --      Pain Edu? --      Excl. in [place]? --     Constitutional: Alert and oriented. Well appearing and in no acute distress. Eyes: Conjunctivae are normal. PERRL. EOMI. Head: Atraumatic. Nose: No  congestion/rhinnorhea. Mouth/Throat: Mucous membranes are moist.  Oropharynx non-erythematous. Neck: No stridor.  No meningeal signs.  No cervical spine tenderness to palpation. Cardiovascular: Normal rate, regular rhythm. Good peripheral circulation. Grossly normal heart sounds.   Respiratory: Normal respiratory effort.  No retractions. Lungs CTAB. Gastrointestinal: Soft and nontender. No distention.  Musculoskeletal: No lower extremity tenderness nor edema. No gross deformities of extremities. Neurologic:  The patient has no gross cranial nerve deficits.  She has slightly decreased major muscle group strength in her left upper extremity but this may be because she is right arm dominant.  She has no appreciable weakness in her left lower extremity.  Pronator drift negative, Romberg negative.  No dysmetria.  No unsteadiness when standing.. Skin:  Skin is warm, dry and intact. No rash noted. Psychiatric: Mood and affect are normal. Speech and behavior are normal.  ____________________________________________   LABS (all labs ordered are listed, but only abnormal results are displayed)  Labs Reviewed  GLUCOSE, CAPILLARY - Abnormal; Notable for the following:    Glucose-Capillary 119 (*)    All other components within normal limits  CBC - Abnormal; Notable for the following:    RDW 16.6 (*)    All other components within normal limits  COMPREHENSIVE METABOLIC PANEL - Abnormal; Notable for the following:    Glucose, Bld 109 (*)    All other components within normal limits  PROTIME-INR  APTT  DIFFERENTIAL  TROPONIN I  CBG MONITORING, ED   ____________________________________________  EKG  ED ECG REPORT I, Yevette Knust, the attending physician, personally viewed and interpreted this ECG.  Date: 04/11/2016 EKG Time: 14:16 Rate: 90 Rhythm: normal sinus rhythm QRS Axis: normal Intervals: QTc slightly prolonged at 501 ms ST/T Wave abnormalities: normal Conduction Disturbances:  none Narrative Interpretation: unremarkable  ____________________________________________  RADIOLOGY   Ct Head Wo Contrast  04/11/2016  CLINICAL DATA:  Headaches EXAM: CT HEAD WITHOUT CONTRAST TECHNIQUE: Contiguous axial images were obtained from the base of the skull through the vertex without intravenous contrast. COMPARISON:  02/28/2008 FINDINGS: No acute cortical infarct, hemorrhage, or mass lesion ispresent. Ventricles are of normal size. No significant extra-axial fluid collection is present. The paranasal sinuses andmastoid air cells are clear. The osseous skull is intact. IMPRESSION: Normal brain Electronically Signed   By: Kerby Moors M.D.   On: 04/11/2016 14:44    ____________________________________________   PROCEDURES  Procedure(s) performed: None  Critical Care performed: No ____________________________________________   INITIAL IMPRESSION / ASSESSMENT AND PLAN / ED COURSE  Pertinent labs & imaging results that were available during my care of the patient were reviewed by me and considered in my medical decision making (see chart for details).  NIH Stroke Scale  Interval: Baseline Time: 3:35 PM Person Administering Scale: Erendida Wrenn  Administer stroke scale items in the order listed. Record performance in each category after each subscale exam. Do not go back and change scores. Follow directions provided for each exam technique. Scores should reflect what the patient does, not what the clinician thinks the patient can do. The clinician should record answers while administering the exam and work quickly. Except where indicated, the patient should not be coached (i.e., repeated requests to patient to make a special effort).   1a  Level of consciousness: 0=alert; keenly responsive  1b. LOC questions:  0=Performs both tasks correctly  1c. LOC commands: 0=Performs both tasks correctly  2.  Best Gaze: 0=normal  3.  Visual: 0=No visual loss  4. Facial Palsy:  0=Normal symmetric movement  5a.  Motor left arm: 1=Drift, limb holds 90 (or 45) degrees but drifts down before full 10 seconds: does not hit bed  5b.  Motor right arm: 0=No drift, limb holds 90 (or 45) degrees for full 10 seconds  6a. motor left leg: 0=No drift, limb holds 90 (or 45) degrees for full 10 seconds  6b  Motor right leg:  0=No drift, limb holds 90 (or 45) degrees for full 10 seconds  7. Limb Ataxia: 0=Absent  8.  Sensory: 0=Normal; no sensory loss  9. Best Language:  0=No aphasia, normal  10. Dysarthria: 0=Normal  11. Extinction and Inattention: 0=No abnormality  12. Distal motor function: 0=Normal   Total:   1   The patient is not a candidate for TPA given the onset of symptoms being 12 hours ago, the very mild deficit, and the rapid improvement after coming to the emergency department.  The patient may have had a mild stroke/TIA but her symptoms of improved dramatically after her blood sugar improved as well.  Her neuro deficits that were appreciated earlier may have been the result of her transient hyperglycemia.  However, her story waking up with severe headache is concerning.  Her noncontrast head CT was normal.  I contacted and spoke by phone with Dr. Doy Mince the neurologist.  We discussed the appropriate management including MR brain and discharge versus further workup, and she recommends for this patient with multiple risk factors that she come into the hospital for old risk stratification, CTA head and neck, and MR brain.  I discussed with the patient who agrees with the plan.  With the hospitalist who also agrees.  I am giving her a full dose aspirin and deferring the additional evaluation to the hospitalist.   ____________________________________________  FINAL CLINICAL IMPRESSION(S) / ED DIAGNOSES  Final diagnoses:  Transient cerebral ischemia, unspecified transient cerebral ischemia type     MEDICATIONS GIVEN DURING THIS VISIT:  Medications  aspirin chewable  tablet 324 mg (not administered)     NEW OUTPATIENT MEDICATIONS STARTED DURING THIS VISIT:  New Prescriptions   No medications on file      Note:  This document was prepared using Dragon voice recognition software and may include unintentional dictation errors.   Hinda Kehr, MD 04/11/16 1623

## 2016-04-11 NOTE — Telephone Encounter (Signed)
Spoke with patient and she stated that BS has came down to 120. She is still having the HA and blurred vision. Advise that she should still be seen. She will go to [facility]

## 2016-04-11 NOTE — ED Notes (Signed)
Patient arrives from home via POV with c/o dizziness, headache, hyperglycemia. Patient noted to have left sided arm and leg droop.  Patient states CBG at home was 252 DM2

## 2016-04-11 NOTE — Telephone Encounter (Signed)
Left message for patient to call back. After speaking with Dr. Caryl Bis patient needs to be seen today at a walk in clinic.

## 2016-04-11 NOTE — Telephone Encounter (Signed)
Patient should be evaluated today.  

## 2016-04-11 NOTE — ED Notes (Signed)
Patient transported to CT 

## 2016-04-11 NOTE — Telephone Encounter (Signed)
Spoke with patient about symptoms, 2 weeks ago bs was as high as 180 in the AM. She states has a pounding HA this AM and blurred vision.

## 2016-04-11 NOTE — ED Notes (Signed)
PT back to treatment room from CT. No acute distress noted. Pt resting with family at bedside.

## 2016-04-11 NOTE — Telephone Encounter (Signed)
Patient had a fasting blood sugar of 252 this morning, she took her medication and it dropped down to 214. She questioned if she should be seen in he office or have a change of medication.  Pt contact [phone] -work

## 2016-04-11 NOTE — ED Notes (Signed)
CBG 119 per [place], [place].

## 2016-04-11 NOTE — H&P (Signed)
[facility] [place] NAME: Bernita Coolidge    MR#:  [id number]  DATE OF BIRTH:  [birth year]  DATE OF ADMISSION:  04/11/2016  PRIMARY CARE PHYSICIAN: [name], MD   REQUESTING/REFERRING PHYSICIAN: Karma Greaser  CHIEF COMPLAINT:   Chief Complaint  Patient presents with  . Extremity Weakness    HISTORY OF PRESENT ILLNESS: Tamara Summers  is a 65 y.o. female with a known history of Hypertension, diabetes, diverticulitis, headache, hyperlipidemia, urinary retention, TIA- all medications on time yesterday. At 4 in the morning she woke up with severe headache and some dizziness but she went back to sleep again and at 7 when she woke up she had still this complains. She checked her blood sugar level and it was 251 which is high for her and so she thought maybe that is why she has this problem. As the day passes her problem continues she continued to feel dizzy so she called her primary care doctor's office they could not see her today but advised to [facility] as she has needed to be seen. She went to walk in [facility], they checked the blood pressure and as for her it was high and they also noted a left-sided upper and lower extremity are weak which she never noticed since morning. Concerned with this they advised her to go to emergency room. CT of the head is negative, ER physician spoke to neurologist on-call and after talking to her she suggested to admit for stroke workup   PAST MEDICAL HISTORY:   Past Medical History  Diagnosis Date  . Hypertension   . Diabetes mellitus without complication ([place])   . Asthma   . Chickenpox   . Diverticulitis   . Headache   . GERD (gastroesophageal reflux disease)   . Allergic rhinitis   . Hyperlipidemia   . History of blood transfusion   . Urinary retention   . TIA (transient ischemic attack)     PAST SURGICAL HISTORY: Past Surgical History  Procedure Laterality Date  . Cholecystectomy  1988  .  Abdominal hysterectomy  1987    SOCIAL HISTORY:  Social History  Substance Use Topics  . Smoking status: Former Research scientist (life sciences)  . Smokeless tobacco: Not on file  . Alcohol Use: 1.2 - 1.8 oz/week    2-3 Standard drinks or equivalent per week    FAMILY HISTORY:  Family History  Problem Relation Age of Onset  . Hyperlipidemia      Parent  . Heart disease      Parent  . Hypertension      Parent  . Stroke      Grandparent  . Mental illness Sister     DRUG ALLERGIES:  Allergies  Allergen Reactions  . Penicillins Hives and Other (See Comments)    Has patient had a PCN reaction causing immediate rash, facial/tongue/throat swelling, SOB or lightheadedness with hypotension: No Has patient had a PCN reaction causing severe rash involving mucus membranes or skin necrosis: No Has patient had a PCN reaction that required hospitalization No Has patient had a PCN reaction occurring within the last 10 years: No If all of the above answers are "NO", then may proceed with Cephalosporin use.  . Sulfa Antibiotics Hives    REVIEW OF SYSTEMS:   CONSTITUTIONAL: No fever,Positive for  fatigue or weakness.  EYES: No blurred or double vision.  EARS, NOSE, AND THROAT: No tinnitus or ear pain.  RESPIRATORY: No cough, shortness of breath, wheezing  or hemoptysis.  CARDIOVASCULAR: No chest pain, orthopnea, edema.  GASTROINTESTINAL: No nausea, vomiting, diarrhea or abdominal pain.  GENITOURINARY: No dysuria, hematuria.  ENDOCRINE: No polyuria, nocturia,  HEMATOLOGY: No anemia, easy bruising or bleeding SKIN: No rash or lesion. MUSCULOSKELETAL: No joint pain or arthritis.   NEUROLOGIC: No tingling, numbness, weakness.  PSYCHIATRY: No anxiety or depression.   MEDICATIONS AT HOME:  Prior to Admission medications   Medication Sig Start Date End Date Taking? Authorizing Provider  albuterol (PROVENTIL HFA;VENTOLIN HFA) 108 (90 BASE) MCG/ACT inhaler Inhale 2 puffs into the lungs every 6 (six) hours as  needed for wheezing or shortness of breath. 10/18/15  Yes Orbie Pyo, MD  atorvastatin (LIPITOR) 40 MG tablet Take 40 mg by mouth daily.    Yes Historical Provider, MD  cholecalciferol (VITAMIN D) 1000 units tablet Take 2,000 Units by mouth daily.   Yes Historical Provider, MD  dexlansoprazole (DEXILANT) 60 MG capsule Take 60 mg by mouth daily.   Yes Historical Provider, MD  escitalopram (LEXAPRO) 10 MG tablet Take 10 mg by mouth daily.   Yes Historical Provider, MD  glimepiride (AMARYL) 2 MG tablet Take 2 mg by mouth daily with breakfast.    Yes Historical Provider, MD  hydrochlorothiazide (HYDRODIURIL) 12.5 MG tablet Take 1 tablet (12.5 mg total) by mouth daily. 11/30/15 05/28/16 Yes Leone Haven, MD  ibuprofen (ADVIL,MOTRIN) 200 MG tablet Take 400 mg by mouth every 6 (six) hours as needed for headache or mild pain.   Yes Historical Provider, MD  metFORMIN (GLUCOPHAGE-XR) 500 MG 24 hr tablet Take 3 tablets (1,500 mg total) by mouth daily with breakfast. 11/30/15  Yes Leone Haven, MD  metoprolol succinate (TOPROL-XL) 25 MG 24 hr tablet Take 25 mg by mouth daily.   Yes Historical Provider, MD  benzonatate (TESSALON) 200 MG capsule Take 1 capsule (200 mg total) by mouth 3 (three) times daily as needed for cough. Patient not taking: Reported on 04/11/2016 12/21/15   Leone Haven, MD  ciprofloxacin (CIPRO) 500 MG tablet Take 1 tablet (500 mg total) by mouth 2 (two) times daily. Patient not taking: Reported on 04/11/2016 03/11/16   Leone Haven, MD  metroNIDAZOLE (FLAGYL) 500 MG tablet Take 1 tablet (500 mg total) by mouth 3 (three) times daily. Patient not taking: Reported on 04/11/2016 03/11/16   Leone Haven, MD      PHYSICAL EXAMINATION:   VITAL SIGNS: Blood pressure 150/91, pulse 83, temperature 98.4 F (36.9 C), resp. rate 17, height 5\' 3"  (1.6 m), weight 63.504 kg (140 lb), SpO2 98 %.  GENERAL:  65 y.o.-year-old patient lying in the bed with no acute distress.   EYES: Pupils equal, round, reactive to light and accommodation. No scleral icterus. Extraocular muscles intact.  HEENT: Head atraumatic, normocephalic. Oropharynx and nasopharynx clear.  NECK:  Supple, no jugular venous distention. No thyroid enlargement, no tenderness.  LUNGS: Normal breath sounds bilaterally, no wheezing, rales,rhonchi or crepitation. No use of accessory muscles of respiration.  CARDIOVASCULAR: S1, S2 normal. No murmurs, rubs, or gallops.  ABDOMEN: Soft, nontender, nondistended. Bowel sounds present. No organomegaly or mass.  EXTREMITIES: No pedal edema, cyanosis, or clubbing.  NEUROLOGIC: Cranial nerves II through XII are intact. Muscle strength 5/5 inRight-sided extremities, and 4 out of 5 in left side upper and lower extremity. Sensation intact. Gait not checked.  PSYCHIATRIC: The patient is alert and oriented x 3.  SKIN: No obvious rash, lesion, or ulcer.   LABORATORY PANEL:   CBC  Recent Labs Lab 04/11/16 1429  WBC 7.3  HGB 13.0  HCT 38.3  PLT 261  MCV 86.6  MCH 29.3  MCHC 33.8  RDW 16.6*  LYMPHSABS 3.0  MONOABS 0.5  EOSABS 0.1  BASOSABS 0.0   ------------------------------------------------------------------------------------------------------------------  Chemistries   Recent Labs Lab 04/11/16 1429  NA 138  K 3.7  CL 102  CO2 23  GLUCOSE 109*  BUN 17  CREATININE 0.71  CALCIUM 9.5  AST 30  ALT 31  ALKPHOS 95  BILITOT 0.6   ------------------------------------------------------------------------------------------------------------------ estimated creatinine clearance is 64.5 mL/min (by C-G formula based on Cr of 0.71). ------------------------------------------------------------------------------------------------------------------ No results for input(s): TSH, T4TOTAL, T3FREE, THYROIDAB in the last 72 hours.  Invalid input(s): FREET3   Coagulation profile  Recent Labs Lab 04/11/16 1429  INR 0.93    ------------------------------------------------------------------------------------------------------------------- No results for input(s): DDIMER in the last 72 hours. -------------------------------------------------------------------------------------------------------------------  Cardiac Enzymes  Recent Labs Lab 04/11/16 1429  TROPONINI <0.03   ------------------------------------------------------------------------------------------------------------------ Invalid input(s): POCBNP  ---------------------------------------------------------------------------------------------------------------  Urinalysis    Component Value Date/Time   COLORURINE Yellow 03/09/2013 1417   APPEARANCEUR Hazy 03/09/2013 1417   LABSPEC 1.020 03/09/2013 1417   PHURINE 5.0 03/09/2013 1417   GLUCOSEU Negative 03/09/2013 1417   HGBUR 2+ 03/09/2013 1417   BILIRUBINUR neg 03/11/2016 1126   BILIRUBINUR Negative 03/09/2013 1417   KETONESUR Negative 03/09/2013 1417   PROTEINUR neg 03/11/2016 1126   PROTEINUR Negative 03/09/2013 1417   UROBILINOGEN 0.2 03/11/2016 1126   NITRITE neg 03/11/2016 1126   NITRITE Negative 03/09/2013 1417   LEUKOCYTESUR Negative 03/11/2016 1126   LEUKOCYTESUR Negative 03/09/2013 1417     RADIOLOGY: Ct Head Wo Contrast  04/11/2016  CLINICAL DATA:  Headaches EXAM: CT HEAD WITHOUT CONTRAST TECHNIQUE: Contiguous axial images were obtained from the base of the skull through the vertex without intravenous contrast. COMPARISON:  02/28/2008 FINDINGS: No acute cortical infarct, hemorrhage, or mass lesion ispresent. Ventricles are of normal size. No significant extra-axial fluid collection is present. The paranasal sinuses andmastoid air cells are clear. The osseous skull is intact. IMPRESSION: Normal brain Electronically Signed   By: Kerby Moors M.D.   On: 04/11/2016 14:44    EKG: Orders placed or performed during the hospital encounter of 04/11/16  . EKG 12-Lead  . EKG  12-Lead  . ED EKG  . ED EKG    IMPRESSION AND PLAN:  * Weakness on left side, dizziness   Maybe TIA or small stroke.   Will keep on cardiac monitoring, get MRI of the brain, CT angiogram head and neck as advised by neurologist on the phone.   We'll get physical therapy evaluation.  * Hypertension   Continue metoprolol as she is taking at home.  * Diabetes   Continue home medication and keep on insulin sliding scale coverage.  * Hyperlipidemia    Continue statin, check lipid panel.  All the records are reviewed and case discussed with ED provider. Management plans discussed with the patient, family and they are in agreement.  CODE STATUS: full. Code Status History    This patient does not have a recorded code status. Please follow your organizational policy for patients in this situation.     Spoke to her husband also , in room.  TOTAL TIME TAKING CARE OF THIS PATIENT: 50 minutes.    Vaughan Basta M.D on 04/11/2016   Between 7am to 6pm - Pager - [phone]  After 6pm go to [email or web address] - password [email or web address]  [employer]  [phone]  CC: Primary care physician; [name], MD   Note: This dictation was prepared with Dragon dictation along with smaller phrase technology. Any transcriptional errors that result from this process are unintentional.

## 2016-04-12 ENCOUNTER — Observation Stay: Payer: 59

## 2016-04-12 DIAGNOSIS — R531 Weakness: Secondary | ICD-10-CM | POA: Diagnosis not present

## 2016-04-12 LAB — CBC
HEMATOCRIT: 37.5 % (ref 35.0–47.0)
HEMOGLOBIN: 12.6 g/dL (ref 12.0–16.0)
MCH: 29.3 pg (ref 26.0–34.0)
MCHC: 33.7 g/dL (ref 32.0–36.0)
MCV: 87.2 fL (ref 80.0–100.0)
Platelets: 248 10*3/uL (ref 150–440)
RBC: 4.3 MIL/uL (ref 3.80–5.20)
RDW: 16.8 % — ABNORMAL HIGH (ref 11.5–14.5)
WBC: 6.7 10*3/uL (ref 3.6–11.0)

## 2016-04-12 LAB — HEMOGLOBIN A1C: Hgb A1c MFr Bld: 7.1 % — ABNORMAL HIGH (ref 4.0–6.0)

## 2016-04-12 LAB — BASIC METABOLIC PANEL
ANION GAP: 10 (ref 5–15)
BUN: 17 mg/dL (ref 6–20)
CHLORIDE: 102 mmol/L (ref 101–111)
CO2: 25 mmol/L (ref 22–32)
Calcium: 8.8 mg/dL — ABNORMAL LOW (ref 8.9–10.3)
Creatinine, Ser: 0.81 mg/dL (ref 0.44–1.00)
GFR calc Af Amer: >60 mL/min (ref >60)
GLUCOSE: 144 mg/dL — AB (ref 65–99)
POTASSIUM: 3.8 mmol/L (ref 3.5–5.1)
Sodium: 137 mmol/L (ref 135–145)

## 2016-04-12 LAB — GLUCOSE, CAPILLARY
GLUCOSE-CAPILLARY: 148 mg/dL — AB (ref 65–99)
GLUCOSE-CAPILLARY: 259 mg/dL — AB (ref 65–99)

## 2016-04-12 MED ORDER — METFORMIN HCL ER 500 MG PO TB24: 2000.0000 mg | ORAL_TABLET | Freq: Every day | ORAL | Status: DC

## 2016-04-12 MED ORDER — ASPIRIN EC 325 MG PO TBEC: 325.0000 mg | DELAYED_RELEASE_TABLET | Freq: Every day | ORAL | Status: DC

## 2016-04-12 MED ORDER — DIAZEPAM 5 MG/ML IJ SOLN
2.0000 mg | Freq: Once | INTRAMUSCULAR | Status: AC
Start: 2016-04-12 — End: 2016-04-12
  Administered 2016-04-12: 2 mg via INTRAVENOUS
  Filled 2016-04-12: qty 2

## 2016-04-12 MED ORDER — GLIMEPIRIDE 4 MG PO TABS: 4.0000 mg | ORAL_TABLET | Freq: Every day | ORAL | Status: DC

## 2016-04-12 NOTE — Progress Notes (Signed)
Physical Therapy Evaluation Patient Details Name: Tamara Summers MRN: [id number] DOB: Mar 23, 1951 Today's Date: 04/12/2016   History of Present Illness  Tamara Summers is a 65 y.o. female with a known history of Hypertension, diabetes, diverticulitis, headache, hyperlipidemia, urinary retention, TIA- all medications on time yesterday.  Clinical Impression  Pt here for CVA workup.  CT head negative, awaiting MRI results.  Pt presents to PT at baseline functional mobility and balance.  Pt reports weakness and visual changes have resolved.  Pt able to walk around nursing station and up and down 6 steps without gait deviation or balance correction.  Pt scored 56/56 on Berg Balance Scale which indicates low risk for falls.  No further acute PT services indicated.    Follow Up Recommendations No PT follow up    Equipment Recommendations  None recommended by PT    Recommendations for Other Services       Precautions / Restrictions Precautions Precautions: Fall Precaution Comments: Mod Restrictions Weight Bearing Restrictions: No      Mobility  Bed Mobility Overal bed mobility: Independent                Transfers Overall transfer level: Independent Equipment used: None             General transfer comment: Good body mechanics and balance with sit<>stand from bed  Ambulation/Gait Ambulation/Gait assistance: Independent Ambulation Distance (Feet): 250 Feet Assistive device: None       General Gait Details: Steady gait with normal cadence, no deviations or balance corrections.  Stairs            Wheelchair Mobility    Modified Rankin (Stroke Patients Only)       Balance                                 Standardized Balance Assessment Standardized Balance Assessment : Berg Balance Test Berg Balance Test Sit to Stand: Able to stand without using hands and stabilize independently Standing Unsupported: Able to stand safely 2 minutes Sitting  with Back Unsupported but Feet Supported on Floor or Stool: Able to sit safely and securely 2 minutes Stand to Sit: Sits safely with minimal use of hands Transfers: Able to transfer safely, minor use of hands Standing Unsupported with Eyes Closed: Able to stand 10 seconds safely Standing Ubsupported with Feet Together: Able to place feet together independently and stand 1 minute safely From Standing, Reach Forward with Outstretched Arm: Can reach confidently >25 cm (10") From Standing Position, Pick up Object from Floor: Able to pick up shoe safely and easily From Standing Position, Turn to Look Behind Over each Shoulder: Looks behind from both sides and weight shifts well Turn 360 Degrees: Able to turn 360 degrees safely in 4 seconds or less Standing Unsupported, Alternately Place Feet on Step/Stool: Able to stand independently and safely and complete 8 steps in 20 seconds Standing Unsupported, One Foot in Front: Able to place foot tandem independently and hold 30 seconds Standing on One Leg: Able to lift leg independently and hold > 10 seconds Total Score: 56         Pertinent Vitals/Pain Pain Assessment: No/denies pain    Home Living Family/patient expects to be discharged to:: Private residence Living Arrangements: Spouse/significant other   Type of Home: House Home Access: Stairs to enter Entrance Stairs-Rails: None Entrance Stairs-Number of Steps: 2 Home Layout: Two level;Able to live on main level with bedroom/bathroom  Home Equipment: None      Prior Function Level of Independence: Independent         Comments: Works at [employer], Teaching laboratory technician, Research scientist (medical)        Extremity/Trunk Assessment   Upper Extremity Assessment: Overall WFL for tasks assessed           Lower Extremity Assessment: Overall WFL for tasks assessed         Communication   Communication: No difficulties  Cognition Arousal/Alertness: Awake/alert Behavior During  Therapy: WFL for tasks assessed/performed Overall Cognitive Status: Within Functional Limits for tasks assessed                      General Comments      Exercises        Assessment/Plan    PT Assessment Patent does not need any further PT services  PT Diagnosis Generalized weakness   PT Problem List    PT Treatment Interventions     PT Goals (Current goals can be found in the Care Plan section) Acute Rehab PT Goals Patient Stated Goal: "I am ready to go home." PT Goal Formulation: With patient Time For Goal Achievement: 04/14/16 Potential to Achieve Goals: Good    Frequency     Barriers to discharge        Co-evaluation               End of Session   Activity Tolerance: Patient tolerated treatment well Patient left: in bed;with family/visitor present Nurse Communication: Mobility status    Functional Assessment Tool Used: BERG balance Functional Limitation: Mobility: Walking and moving around Mobility: Walking and Moving Around Current Status [id number]): 0 percent impaired, limited or restricted Mobility: Walking and Moving Around Goal Status [id number]): 0 percent impaired, limited or restricted Mobility: Walking and Moving Around Discharge Status [phone]): 0 percent impaired, limited or restricted    Time: 1340-1410 PT Time Calculation (min) (ACUTE ONLY): 30 min   Charges:   PT Evaluation $PT Eval Moderate Complexity: 1 Procedure     PT G Codes:   PT G-Codes **NOT FOR INPATIENT CLASS** Functional Assessment Tool Used: BERG balance Functional Limitation: Mobility: Walking and moving around Mobility: Walking and Moving Around Current Status [id number]): 0 percent impaired, limited or restricted Mobility: Walking and Moving Around Goal Status [id number]): 0 percent impaired, limited or restricted Mobility: Walking and Moving Around Discharge Status [id number]): 0 percent impaired, limited or restricted    Nicholos Aloisi A Julian Askin, PT 04/12/2016, 2:14 PM

## 2016-04-12 NOTE — Progress Notes (Signed)
Dr Posey Pronto ordered valium 2 mg IV once for MRI

## 2016-04-12 NOTE — Consult Note (Signed)
CC: L sided weakness   HPI: Tamara Summers is an 65 y.o. female  with a known history of Hypertension, diabetes, diverticulitis, headache, hyperlipidemia, urinary retention admitted with HA, elevated glc, HTN and L sided weakness that has improved.  CT of the head is negative for acute abnormality.     Past Medical History  Diagnosis Date  . Hypertension   . Diabetes mellitus without complication ([place])   . Asthma   . Chickenpox   . Diverticulitis   . Headache   . GERD (gastroesophageal reflux disease)   . Allergic rhinitis   . Hyperlipidemia   . History of blood transfusion   . Urinary retention   . TIA (transient ischemic attack)     Past Surgical History  Procedure Laterality Date  . Cholecystectomy  1988  . Abdominal hysterectomy  1987    Family History  Problem Relation Age of Onset  . Hyperlipidemia      Parent  . Heart disease      Parent  . Hypertension      Parent  . Stroke      Grandparent  . Mental illness Sister     Social History:  reports that she has quit smoking. She does not have any smokeless tobacco history on file. She reports that she drinks about 1.2 - 1.8 oz of alcohol per week. She reports that she does not use illicit drugs.  Allergies  Allergen Reactions  . Penicillins Hives and Other (See Comments)    Has patient had a PCN reaction causing immediate rash, facial/tongue/throat swelling, SOB or lightheadedness with hypotension: No Has patient had a PCN reaction causing severe rash involving mucus membranes or skin necrosis: No Has patient had a PCN reaction that required hospitalization No Has patient had a PCN reaction occurring within the last 10 years: No If all of the above answers are "NO", then may proceed with Cephalosporin use.  . Sulfa Antibiotics Hives    Medications: I have reviewed the patient's current medications.  ROS: History obtained from the patient  General ROS: negative for - chills, fatigue, fever, night sweats,  weight gain or weight loss Psychological ROS: negative for - behavioral disorder, hallucinations, memory difficulties, mood swings or suicidal ideation Ophthalmic ROS: negative for - blurry vision, double vision, eye pain or loss of vision ENT ROS: negative for - epistaxis, nasal discharge, oral lesions, sore throat, tinnitus or vertigo Allergy and Immunology ROS: negative for - hives or itchy/watery eyes Hematological and Lymphatic ROS: negative for - bleeding problems, bruising or swollen lymph nodes Endocrine ROS: negative for - galactorrhea, hair pattern changes, polydipsia/polyuria or temperature intolerance Respiratory ROS: negative for - cough, hemoptysis, shortness of breath or wheezing Cardiovascular ROS: negative for - chest pain, dyspnea on exertion, edema or irregular heartbeat Gastrointestinal ROS: negative for - abdominal pain, diarrhea, hematemesis, nausea/vomiting or stool incontinence Genito-Urinary ROS: negative for - dysuria, hematuria, incontinence or urinary frequency/urgency Musculoskeletal ROS: negative for - joint swelling or muscular weakness Neurological ROS: as noted in HPI Dermatological ROS: negative for rash and skin lesion changes  Physical Examination: Blood pressure 115/75, pulse 83, temperature 98.1 F (36.7 C), temperature source Oral, resp. rate 16, height 5\' 3"  (1.6 m), weight 63.504 kg (140 lb), SpO2 95 %.  Neurological Examination Mental Status: Alert, oriented, thought content appropriate.  Speech fluent without evidence of aphasia.  Able to follow 3 step commands without difficulty. Cranial Nerves: II: Discs flat bilaterally; Visual fields grossly normal, pupils equal, round, reactive to  light and accommodation III,IV, VI: ptosis not present, extra-ocular motions intact bilaterally V,VII: smile symmetric, facial light touch sensation normal bilaterally VIII: hearing normal bilaterally IX,X: gag reflex present XI: bilateral shoulder shrug XII:  midline tongue extension Motor: Right : Upper extremity   5/5    Left:     Upper extremity   5/5  Lower extremity   5/5     Lower extremity   5/5 Tone and bulk:normal tone throughout; no atrophy noted Sensory: Pinprick and light touch intact throughout, bilaterally Deep Tendon Reflexes: 1+ and symmetric throughout Plantars: Right: downgoing   Left: downgoing Cerebellar: normal finger-to-nose, normal rapid alternating movements and normal heel-to-shin test Gait: normal gait and station      Laboratory Studies:   Basic Metabolic Panel:  Recent Labs Lab 04/11/16 1429 04/12/16 0621  NA 138 137  K 3.7 3.8  CL 102 102  CO2 23 25  GLUCOSE 109* 144*  BUN 17 17  CREATININE 0.71 0.81  CALCIUM 9.5 8.8*    Liver Function Tests:  Recent Labs Lab 04/11/16 1429  AST 30  ALT 31  ALKPHOS 95  BILITOT 0.6  PROT 7.2  ALBUMIN 4.2   No results for input(s): LIPASE, AMYLASE in the last 168 hours. No results for input(s): AMMONIA in the last 168 hours.  CBC:  Recent Labs Lab 04/11/16 1429 04/12/16 0621  WBC 7.3 6.7  NEUTROABS 3.6  --   HGB 13.0 12.6  HCT 38.3 37.5  MCV 86.6 87.2  PLT 261 248    Cardiac Enzymes:  Recent Labs Lab 04/11/16 1429  TROPONINI <0.03    BNP: Invalid input(s): POCBNP  CBG:  Recent Labs Lab 04/11/16 1411 04/11/16 1835 04/11/16 2020 04/12/16 0735 04/12/16 1117  GLUCAP 119* 97 250* 148* 259*    Microbiology: Results for orders placed or performed in visit on 03/11/16  Urine culture     Status: None   Collection Time: 03/11/16 12:01 PM  Result Value Ref Range Status   Urine Culture, Routine Final report  Final   Urine Culture result 1 No growth  Final    Coagulation Studies:  Recent Labs  04/11/16 1429  LABPROT 12.7  INR 0.93    Urinalysis: No results for input(s): COLORURINE, LABSPEC, PHURINE, GLUCOSEU, HGBUR, BILIRUBINUR, KETONESUR, PROTEINUR, UROBILINOGEN, NITRITE, LEUKOCYTESUR in the last 168 hours.  Invalid  input(s): APPERANCEUR  Lipid Panel:     Component Value Date/Time   CHOL 149 04/11/2016 1429   TRIG 217* 04/11/2016 1429   HDL 53 04/11/2016 1429   CHOLHDL 2.8 04/11/2016 1429   VLDL 43* 04/11/2016 1429   LDLCALC 53 04/11/2016 1429    HgbA1C:  Lab Results  Component Value Date   HGBA1C 7.1* 04/11/2016    Urine Drug Screen:  No results found for: LABOPIA, COCAINSCRNUR, LABBENZ, AMPHETMU, THCU, LABBARB  Alcohol Level: No results for input(s): ETH in the last 168 hours.  Other results: EKG: normal EKG, normal sinus rhythm, unchanged from previous tracings.  Imaging: Ct Angio Head W/cm &/or Wo Cm  04/11/2016  CLINICAL DATA:  New onset dizziness, weakness, and headache. Hyperglycemia. Left-sided arm and leg weakness. EXAM: CT ANGIOGRAPHY HEAD AND NECK TECHNIQUE: Multidetector CT imaging of the head and neck was performed using the standard protocol during bolus administration of intravenous contrast. Multiplanar CT image reconstructions and MIPs were obtained to evaluate the vascular anatomy. Carotid stenosis measurements (when applicable) are obtained utilizing NASCET criteria, using the distal internal carotid diameter as the denominator. CONTRAST:  75  mL Isovue 370 COMPARISON:  MRI brain 12/20/2007.  MRA head 02/28/2008. FINDINGS: CT HEAD Brain: Mild scattered white matter hypoattenuation is present bilaterally. No acute infarct, hemorrhage, or mass lesion is present. Calvarium and skull base: Within normal limits Paranasal sinuses: Clear Orbits: Unremarkable. CTA NECK Aortic arch: There is a common origin of the innominate artery and the left common carotid artery. Calcifications are present at the origin of the left subclavian artery without significant stenosis. Right carotid system: The right common carotid artery is tortuous without focal stenosis. The bifurcation is unremarkable. Cervical right ICA is mildly tortuous is well. No focal stenosis is present. Left carotid system: The left  common carotid artery is within normal limits. The bifurcation is normal. Mild tortuosity is present in the cervical left internal carotid artery Vertebral arteries:The vertebral arteries both originate from the subclavian arteries. Vertebral arteries are codominant the neck. No focal stenosis or vascular injury is present in either vertebral artery within the neck. Skeleton: The vertebral body heights alignment are maintained. There is some straightening of the normal cervical lordosis. Mild uncovertebral spurring is present at C4-5 and C5-6. No focal lytic or blastic lesions are present. Other neck: No focal mucosal or submucosal lesions are present. Vocal cords are midline and symmetric. The thyroid is normal. No significant adenopathy is present. Salivary glands are within normal limits bilaterally. CTA HEAD Anterior circulation: The internal carotid arteries are within normal limits from the high cervical segments through the ICA termini bilaterally. The A1 and M1 segments are normal. The anterior communicating artery is patent. The MCA bifurcations are intact. MCA ACA branch vessels are normal bilaterally. Posterior circulation: The left vertebral artery is slightly dominant above the foramen magnum. The right PICA origin is visualized and normal. The vertebrobasilar junction is normal. Both posterior cerebral arteries originate from the basilar tip. PCA branch vessels are intact. Venous sinuses: The dural sinuses are patent. The sagittal sinuses are codominant. The straight sinus and deep cerebral veins are intact. The cortical veins are within normal limits bilaterally. Anatomic variants: None Delayed phase: The postcontrast images demonstrate no pathologic enhancement. Electronically Signed   By: San Morelle M.D.   On: 04/11/2016 18:22   Ct Head Wo Contrast  04/11/2016  CLINICAL DATA:  Headaches EXAM: CT HEAD WITHOUT CONTRAST TECHNIQUE: Contiguous axial images were obtained from the base of the  skull through the vertex without intravenous contrast. COMPARISON:  02/28/2008 FINDINGS: No acute cortical infarct, hemorrhage, or mass lesion ispresent. Ventricles are of normal size. No significant extra-axial fluid collection is present. The paranasal sinuses andmastoid air cells are clear. The osseous skull is intact. IMPRESSION: Normal brain Electronically Signed   By: Kerby Moors M.D.   On: 04/11/2016 14:44   Ct Angio Neck W/cm &/or Wo/cm  04/11/2016  CLINICAL DATA:  New onset dizziness, weakness, and headache. Hyperglycemia. Left-sided arm and leg weakness. EXAM: CT ANGIOGRAPHY HEAD AND NECK TECHNIQUE: Multidetector CT imaging of the head and neck was performed using the standard protocol during bolus administration of intravenous contrast. Multiplanar CT image reconstructions and MIPs were obtained to evaluate the vascular anatomy. Carotid stenosis measurements (when applicable) are obtained utilizing NASCET criteria, using the distal internal carotid diameter as the denominator. CONTRAST:  75 mL Isovue 370 COMPARISON:  MRI brain 12/20/2007.  MRA head 02/28/2008. FINDINGS: CT HEAD Brain: Mild scattered white matter hypoattenuation is present bilaterally. No acute infarct, hemorrhage, or mass lesion is present. Calvarium and skull base: Within normal limits Paranasal sinuses: Clear Orbits: Unremarkable.  CTA NECK Aortic arch: There is a common origin of the innominate artery and the left common carotid artery. Calcifications are present at the origin of the left subclavian artery without significant stenosis. Right carotid system: The right common carotid artery is tortuous without focal stenosis. The bifurcation is unremarkable. Cervical right ICA is mildly tortuous is well. No focal stenosis is present. Left carotid system: The left common carotid artery is within normal limits. The bifurcation is normal. Mild tortuosity is present in the cervical left internal carotid artery Vertebral arteries:The  vertebral arteries both originate from the subclavian arteries. Vertebral arteries are codominant the neck. No focal stenosis or vascular injury is present in either vertebral artery within the neck. Skeleton: The vertebral body heights alignment are maintained. There is some straightening of the normal cervical lordosis. Mild uncovertebral spurring is present at C4-5 and C5-6. No focal lytic or blastic lesions are present. Other neck: No focal mucosal or submucosal lesions are present. Vocal cords are midline and symmetric. The thyroid is normal. No significant adenopathy is present. Salivary glands are within normal limits bilaterally. CTA HEAD Anterior circulation: The internal carotid arteries are within normal limits from the high cervical segments through the ICA termini bilaterally. The A1 and M1 segments are normal. The anterior communicating artery is patent. The MCA bifurcations are intact. MCA ACA branch vessels are normal bilaterally. Posterior circulation: The left vertebral artery is slightly dominant above the foramen magnum. The right PICA origin is visualized and normal. The vertebrobasilar junction is normal. Both posterior cerebral arteries originate from the basilar tip. PCA branch vessels are intact. Venous sinuses: The dural sinuses are patent. The sagittal sinuses are codominant. The straight sinus and deep cerebral veins are intact. The cortical veins are within normal limits bilaterally. Anatomic variants: None Delayed phase: The postcontrast images demonstrate no pathologic enhancement. Electronically Signed   By: San Morelle M.D.   On: 04/11/2016 18:22     Assessment/Plan:   65 y.o. female  with a known history of Hypertension, diabetes, diverticulitis, headache, hyperlipidemia, urinary retention admitted with HA, elevated glc, HTN and L sided weakness that has improved.  CT of the head is negative for acute abnormality.    MRI of brain prelim I don't see acute  abnormality.   Slight drift LUE and LLE improved.   - ASA 81 daily  - tight glc control/ BP control - d/c planning today.  Leotis Pain  04/12/2016, 1:35 PM

## 2016-04-12 NOTE — Discharge Instructions (Signed)
°  DIET:  Diabetic diet, cardiac diet  DISCHARGE CONDITION:  Stable  ACTIVITY:  Activity as tolerated  OXYGEN:  Home Oxygen: No.   Oxygen Delivery: room air  DISCHARGE LOCATION:  home    ADDITIONAL DISCHARGE INSTRUCTION:   If you experience worsening of your admission symptoms, develop shortness of breath, life threatening emergency, suicidal or homicidal thoughts you must seek medical attention immediately by calling 911 or calling your MD immediately  if symptoms less severe.  You Must read complete instructions/literature along with all the possible adverse reactions/side effects for all the Medicines you take and that have been prescribed to you. Take any new Medicines after you have completely understood and accpet all the possible adverse reactions/side effects.   Please note  You were cared for by a hospitalist during your hospital stay. If you have any questions about your discharge medications or the care you received while you were in the hospital after you are discharged, you can call the unit and asked to speak with the hospitalist on call if the hospitalist that took care of you is not available. Once you are discharged, your primary care physician will handle any further medical issues. Please note that NO REFILLS for any discharge medications will be authorized once you are discharged, as it is imperative that you return to your primary care physician (or establish a relationship with a primary care physician if you do not have one) for your aftercare needs so that they can reassess your need for medications and monitor your lab values.

## 2016-04-12 NOTE — Progress Notes (Signed)
Patient discharged home per MD order. Prescriptions given to patient. Stroke booklet given to patient. All discharge instructions given and all questions answered.

## 2016-04-13 NOTE — Discharge Summary (Signed)
Tamara Summers, 65 y.o., DOB 06-03-1951, MRN [id number]. Admission date: 04/11/2016 Discharge Date 04/13/2016 Primary MD [name], MD Admitting Physician Vaughan Basta, MD  Admission Diagnosis  Weakness [R53.1] CVA (cerebral infarction) [I63.9] Transient cerebral ischemia, unspecified transient cerebral ischemia type [G45.9]  Discharge Diagnosis   Principal Problem:  TIA  Hypertension Diabetes type 2 Asthma GERD Hyperlipidemia        Hospital Course  patient is a 65 year old white female with history of hypertension, diabetes, hyperlipidemia who presented with complaint of severe headache and dizziness. Patient came to the ED and had a CT scan of the head which was negative she was admitted for possible TIA. She also had a MRI of the brain which was negative for any evidence of CVA. She had a CT angiogram of head and neck which showed no significant stenosis. She was seen by neurology recommended aspirin therapy. Patient's symptoms have now resolved feeling much better. Her sugars have been elevated at home her diabetic regimen is adjusted. She'll is referred to endocrinology as outpatient.            Consults  None and neurology  Significant Tests:  See full reports for all details      Ct Angio Head W/cm &/or Wo Cm  04/12/2016  ADDENDUM REPORT: 04/12/2016 13:44 IMPRESSION: Mild tortuosity of the cervical internal carotid arteries bilaterally without significant stenosis. Otherwise unremarkable CTA of the neck. Negative CTA head. Normal variant circle of Willis without significant proximal stenosis, aneurysm, or branch vessel occlusion. Electronically Signed   By: San Morelle M.D.   On: 04/12/2016 13:44  04/12/2016  CLINICAL DATA:  New onset dizziness, weakness, and headache. Hyperglycemia. Left-sided arm and leg weakness. EXAM: CT ANGIOGRAPHY HEAD AND NECK TECHNIQUE: Multidetector CT imaging of the head and neck was performed using the standard protocol  during bolus administration of intravenous contrast. Multiplanar CT image reconstructions and MIPs were obtained to evaluate the vascular anatomy. Carotid stenosis measurements (when applicable) are obtained utilizing NASCET criteria, using the distal internal carotid diameter as the denominator. CONTRAST:  75 mL Isovue 370 COMPARISON:  MRI brain 12/20/2007.  MRA head 02/28/2008. FINDINGS: CT HEAD Brain: Mild scattered white matter hypoattenuation is present bilaterally. No acute infarct, hemorrhage, or mass lesion is present. Calvarium and skull base: Within normal limits Paranasal sinuses: Clear Orbits: Unremarkable. CTA NECK Aortic arch: There is a common origin of the innominate artery and the left common carotid artery. Calcifications are present at the origin of the left subclavian artery without significant stenosis. Right carotid system: The right common carotid artery is tortuous without focal stenosis. The bifurcation is unremarkable. Cervical right ICA is mildly tortuous is well. No focal stenosis is present. Left carotid system: The left common carotid artery is within normal limits. The bifurcation is normal. Mild tortuosity is present in the cervical left internal carotid artery Vertebral arteries:The vertebral arteries both originate from the subclavian arteries. Vertebral arteries are codominant the neck. No focal stenosis or vascular injury is present in either vertebral artery within the neck. Skeleton: The vertebral body heights alignment are maintained. There is some straightening of the normal cervical lordosis. Mild uncovertebral spurring is present at C4-5 and C5-6. No focal lytic or blastic lesions are present. Other neck: No focal mucosal or submucosal lesions are present. Vocal cords are midline and symmetric. The thyroid is normal. No significant adenopathy is present. Salivary glands are within normal limits bilaterally. CTA HEAD Anterior circulation: The internal carotid arteries are  within normal limits from  the high cervical segments through the ICA termini bilaterally. The A1 and M1 segments are normal. The anterior communicating artery is patent. The MCA bifurcations are intact. MCA ACA branch vessels are normal bilaterally. Posterior circulation: The left vertebral artery is slightly dominant above the foramen magnum. The right PICA origin is visualized and normal. The vertebrobasilar junction is normal. Both posterior cerebral arteries originate from the basilar tip. PCA branch vessels are intact. Venous sinuses: The dural sinuses are patent. The sagittal sinuses are codominant. The straight sinus and deep cerebral veins are intact. The cortical veins are within normal limits bilaterally. Anatomic variants: None Delayed phase: The postcontrast images demonstrate no pathologic enhancement. Electronically Signed: By: San Morelle M.D. On: 04/11/2016 18:22   Ct Head Wo Contrast  04/11/2016  CLINICAL DATA:  Headaches EXAM: CT HEAD WITHOUT CONTRAST TECHNIQUE: Contiguous axial images were obtained from the base of the skull through the vertex without intravenous contrast. COMPARISON:  02/28/2008 FINDINGS: No acute cortical infarct, hemorrhage, or mass lesion ispresent. Ventricles are of normal size. No significant extra-axial fluid collection is present. The paranasal sinuses andmastoid air cells are clear. The osseous skull is intact. IMPRESSION: Normal brain Electronically Signed   By: Kerby Moors M.D.   On: 04/11/2016 14:44   Ct Angio Neck W/cm &/or Wo/cm  04/12/2016  ADDENDUM REPORT: 04/12/2016 13:44 IMPRESSION: Mild tortuosity of the cervical internal carotid arteries bilaterally without significant stenosis. Otherwise unremarkable CTA of the neck. Negative CTA head. Normal variant circle of Willis without significant proximal stenosis, aneurysm, or branch vessel occlusion. Electronically Signed   By: San Morelle M.D.   On: 04/12/2016 13:44  04/12/2016  CLINICAL  DATA:  New onset dizziness, weakness, and headache. Hyperglycemia. Left-sided arm and leg weakness. EXAM: CT ANGIOGRAPHY HEAD AND NECK TECHNIQUE: Multidetector CT imaging of the head and neck was performed using the standard protocol during bolus administration of intravenous contrast. Multiplanar CT image reconstructions and MIPs were obtained to evaluate the vascular anatomy. Carotid stenosis measurements (when applicable) are obtained utilizing NASCET criteria, using the distal internal carotid diameter as the denominator. CONTRAST:  75 mL Isovue 370 COMPARISON:  MRI brain 12/20/2007.  MRA head 02/28/2008. FINDINGS: CT HEAD Brain: Mild scattered white matter hypoattenuation is present bilaterally. No acute infarct, hemorrhage, or mass lesion is present. Calvarium and skull base: Within normal limits Paranasal sinuses: Clear Orbits: Unremarkable. CTA NECK Aortic arch: There is a common origin of the innominate artery and the left common carotid artery. Calcifications are present at the origin of the left subclavian artery without significant stenosis. Right carotid system: The right common carotid artery is tortuous without focal stenosis. The bifurcation is unremarkable. Cervical right ICA is mildly tortuous is well. No focal stenosis is present. Left carotid system: The left common carotid artery is within normal limits. The bifurcation is normal. Mild tortuosity is present in the cervical left internal carotid artery Vertebral arteries:The vertebral arteries both originate from the subclavian arteries. Vertebral arteries are codominant the neck. No focal stenosis or vascular injury is present in either vertebral artery within the neck. Skeleton: The vertebral body heights alignment are maintained. There is some straightening of the normal cervical lordosis. Mild uncovertebral spurring is present at C4-5 and C5-6. No focal lytic or blastic lesions are present. Other neck: No focal mucosal or submucosal lesions  are present. Vocal cords are midline and symmetric. The thyroid is normal. No significant adenopathy is present. Salivary glands are within normal limits bilaterally. CTA HEAD Anterior circulation: The  internal carotid arteries are within normal limits from the high cervical segments through the ICA termini bilaterally. The A1 and M1 segments are normal. The anterior communicating artery is patent. The MCA bifurcations are intact. MCA ACA branch vessels are normal bilaterally. Posterior circulation: The left vertebral artery is slightly dominant above the foramen magnum. The right PICA origin is visualized and normal. The vertebrobasilar junction is normal. Both posterior cerebral arteries originate from the basilar tip. PCA branch vessels are intact. Venous sinuses: The dural sinuses are patent. The sagittal sinuses are codominant. The straight sinus and deep cerebral veins are intact. The cortical veins are within normal limits bilaterally. Anatomic variants: None Delayed phase: The postcontrast images demonstrate no pathologic enhancement. Electronically Signed: By: San Morelle M.D. On: 04/11/2016 18:22   Mr Brain Wo Contrast  04/12/2016  CLINICAL DATA:  Left upper and lower extremity weakness beginning yesterday. New onset dizziness. EXAM: MRI HEAD WITHOUT CONTRAST TECHNIQUE: Multiplanar, multiecho pulse sequences of the brain and surrounding structures were obtained without intravenous contrast. COMPARISON:  CT of the head and neck 04/11/2016. FINDINGS: The diffusion-weighted images demonstrate no evidence for acute or subacute infarction. A remote hemorrhages noted along the superior aspect of the left cerebellum. No acute hemorrhage is present. Moderate periventricular and scattered subcortical T2 changes bilaterally are advanced for age. The internal auditory canals are within normal limits bilaterally. The brainstem and cerebellum are normal. Flow is present in the major intracranial arteries.  The globes and orbits are intact. The paranasal sinuses and the mastoid air cells are clear. The skullbase is within normal limits. The skullbase is within normal limits. Midline sagittal images are unremarkable. IMPRESSION: 1. No acute intracranial abnormality. 2. 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 3. Moderate age advanced periventricular and subcortical white matter changes bilaterally. The finding is nonspecific but can be seen in the setting of chronic microvascular ischemia, a demyelinating process such as multiple sclerosis, vasculitis, complicated migraine headaches, or as the sequelae of a prior infectious or inflammatory process. Electronically Signed   By: San Morelle M.D.   On: 04/12/2016 13:42       Today   Subjective:   Tamara Summers  feeling better no significant complaints at this point Objective:   Blood pressure 115/75, pulse 83, temperature 98.1 F (36.7 C), temperature source Oral, resp. rate 16, height 5\' 3"  (1.6 m), weight 63.504 kg (140 lb), SpO2 95 %.  .  Intake/Output Summary (Last 24 hours) at 04/13/16 1313 Last data filed at 04/12/16 1333  Gross per 24 hour  Intake    240 ml  Output      0 ml  Net    240 ml    Exam VITAL SIGNS: Blood pressure 115/75, pulse 83, temperature 98.1 F (36.7 C), temperature source Oral, resp. rate 16, height 5\' 3"  (1.6 m), weight 63.504 kg (140 lb), SpO2 95 %.  GENERAL:  66 y.o.-year-old patient lying in the bed with no acute distress.  EYES: Pupils equal, round, reactive to light and accommodation. No scleral icterus. Extraocular muscles intact.  HEENT: Head atraumatic, normocephalic. Oropharynx and nasopharynx clear.  NECK:  Supple, no jugular venous distention. No thyroid enlargement, no tenderness.  LUNGS: Normal breath sounds bilaterally, no wheezing, rales,rhonchi or crepitation. No use of accessory muscles of respiration.  CARDIOVASCULAR: S1, S2 normal. No  murmurs, rubs, or gallops.  ABDOMEN: Soft, nontender, nondistended. Bowel sounds present. No organomegaly or mass.  EXTREMITIES: No pedal edema, cyanosis,  or clubbing.  NEUROLOGIC: Cranial nerves II through XII are intact. Muscle strength 5/5 in all extremities. Sensation intact. Gait not checked.  PSYCHIATRIC: The patient is alert and oriented x 3.  SKIN: No obvious rash, lesion, or ulcer.   Data Review     CBC w Diff: Lab Results  Component Value Date   WBC 6.7 04/12/2016   WBC 6.7 03/11/2016   WBC 16.0* 03/09/2013   HGB 12.6 04/12/2016   HGB 14.6 03/09/2013   HCT 37.5 04/12/2016   HCT 39.4 03/11/2016   HCT 42.5 03/09/2013   PLT 248 04/12/2016   PLT 321 03/11/2016   PLT 305 03/09/2013   LYMPHOPCT 41 04/11/2016   MONOPCT 7 04/11/2016   EOSPCT 2 04/11/2016   BASOPCT 0 04/11/2016   CMP: Lab Results  Component Value Date   NA 137 04/12/2016   NA 137 03/09/2013   K 3.8 04/12/2016   K 3.9 03/09/2013   CL 102 04/12/2016   CL 105 03/09/2013   CO2 25 04/12/2016   CO2 24 03/09/2013   BUN 17 04/12/2016   BUN 19* 03/09/2013   CREATININE 0.81 04/12/2016   CREATININE 0.90 03/09/2013   PROT 7.2 04/11/2016   PROT 7.8 03/09/2013   ALBUMIN 4.2 04/11/2016   ALBUMIN 4.1 03/09/2013   BILITOT 0.6 04/11/2016   BILITOT 0.4 03/09/2013   ALKPHOS 95 04/11/2016   ALKPHOS 83 03/09/2013   AST 30 04/11/2016   AST 46* 03/09/2013   ALT 31 04/11/2016   ALT 50 03/09/2013  .  Micro Results No results found for this or any previous visit (from the past 240 hour(s)).   Code Status History    Date Active Date Inactive Code Status Order ID Comments User Context   04/11/2016  7:02 PM 04/12/2016  5:38 PM Full Code [id number]  Vaughan Basta, MD Inpatient          Follow-up Information    Follow up with [name], MD In 7 days.   Specialty:  Family Medicine   Contact information:   [place] [id number] [phone]       Follow up with  Lucilla Lame, MD In 10 days.   Specialty:  Internal Medicine   Contact information:   [place] [id number] [phone]       Discharge Medications     Medication List    TAKE these medications        albuterol 108 (90 Base) MCG/ACT inhaler  Commonly known as:  PROVENTIL HFA;VENTOLIN HFA  Inhale 2 puffs into the lungs every 6 (six) hours as needed for wheezing or shortness of breath.     aspirin EC 325 MG tablet  Take 1 tablet (325 mg total) by mouth daily.     atorvastatin 40 MG tablet  Commonly known as:  LIPITOR  Take 40 mg by mouth daily.     benzonatate 200 MG capsule  Commonly known as:  TESSALON  Take 1 capsule (200 mg total) by mouth 3 (three) times daily as needed for cough.     cholecalciferol 1000 units tablet  Commonly known as:  VITAMIN D  Take 2,000 Units by mouth daily.     ciprofloxacin 500 MG tablet  Commonly known as:  CIPRO  Take 1 tablet (500 mg total) by mouth 2 (two) times daily.     DEXILANT 60 MG capsule  Generic drug:  dexlansoprazole  Take 60 mg by mouth daily.  escitalopram 10 MG tablet  Commonly known as:  LEXAPRO  Take 10 mg by mouth daily.     glimepiride 4 MG tablet  Commonly known as:  AMARYL  Take 1 tablet (4 mg total) by mouth daily with breakfast.     hydrochlorothiazide 12.5 MG tablet  Commonly known as:  HYDRODIURIL  Take 1 tablet (12.5 mg total) by mouth daily.     ibuprofen 200 MG tablet  Commonly known as:  ADVIL,MOTRIN  Take 400 mg by mouth every 6 (six) hours as needed for headache or mild pain.     metFORMIN 500 MG 24 hr tablet  Commonly known as:  GLUCOPHAGE-XR  Take 4 tablets (2,000 mg total) by mouth daily with breakfast.     metoprolol succinate 25 MG 24 hr tablet  Commonly known as:  TOPROL-XL  Take 25 mg by mouth daily.     metroNIDAZOLE 500 MG tablet  Commonly known as:  FLAGYL  Take 1 tablet (500 mg total) by mouth 3 (three) times daily.            Total Time in preparing paper work, data evaluation and todays exam - 35 minutes  Dustin Flock M.D on 04/13/2016 at 1:13 PM  [facility]   Office  [phone]

## 2016-04-14 ENCOUNTER — Telehealth: Payer: Self-pay

## 2016-04-14 ENCOUNTER — Telehealth: Payer: Self-pay | Admitting: Family Medicine

## 2016-04-14 NOTE — Telephone Encounter (Signed)
Unable to reach patient.  Left message to call the office back in regards to transitional care management.  Will call again 04/15/16 and follow as appropriate.

## 2016-04-14 NOTE — Telephone Encounter (Signed)
FYI, Pt was discharged from hospital on 06/03. Dx was BP/Blood sugar. Pt is scheduled for 04/30/2016. Thank you!

## 2016-04-14 NOTE — Telephone Encounter (Signed)
Thank you.  Will continue to follow as appropriate.

## 2016-04-15 ENCOUNTER — Telehealth: Payer: Self-pay

## 2016-04-15 NOTE — Telephone Encounter (Addendum)
Transition Care Management Follow-up Telephone Call   Date discharged? 04/13/16   How have you been since you were released from the hospital? No headache or dizziness since discharge. Tired, but resting well.  No slurred speech.  No facial drooping or particular weakness in extremities. Fasting BS 219 today.  Checking BS x3 daily and this was the highest number so far. I have tried to schedule an appointment with Endocrinology and was told I needed a referral because I was discharged on a Saturday.   Do you understand why you were in the hospital? YES, higher than normal BS, headache and dizziness.   Do you understand the discharge instructions? YES, increase activity as tolerated.  Follow up with referral appointments.   Where were you discharged to? HOME.   Items Reviewed:  Medications reviewed: YES, continuing scheduled medications without issues.    Allergies reviewed: YES, Penicillins, Sulfa Antibiotics.  Dietary changes reviewed: YES, heart healthy/diabetic diet.    Referrals reviewed: YES, Appointment to be scheduled with Endocrinology.  Appointment scheduled with provider.   Functional Questionnaire:   Activities of Daily Living (ADLs):   She states they are independent in the following: Independent in all ADLs States they require assistance with the following: Does not require assistance at this time.   Any transportation issues/concerns?: NO.   Any patient concerns? YES, discuss why BS fluctuates so much and referral needed for endocrinology.    Confirmed importance and date/time of follow-up visits scheduled YES, appointment scheduled for 04/18/16 at 11:30.  Provider Appointment booked with Dr. Lacinda Axon (Lead Physician).  PCP out of the office on scheduled leave.  Confirmed with patient if condition begins to worsen call PCP or go to the ER.  Patient was given the office number and encouraged to call back with question or concerns.  : YES, verbalized  understanding.

## 2016-04-18 ENCOUNTER — Ambulatory Visit (INDEPENDENT_AMBULATORY_CARE_PROVIDER_SITE_OTHER): Payer: 59 | Admitting: Family Medicine

## 2016-04-18 ENCOUNTER — Encounter: Payer: Self-pay | Admitting: Family Medicine

## 2016-04-18 VITALS — BP 132/76 | HR 84 | Temp 97.8°F | Ht 64.0 in | Wt 140.5 lb

## 2016-04-18 DIAGNOSIS — Z5189 Encounter for other specified aftercare: Secondary | ICD-10-CM | POA: Diagnosis not present

## 2016-04-18 DIAGNOSIS — R2990 Unspecified symptoms and signs involving the nervous system: Secondary | ICD-10-CM

## 2016-04-18 DIAGNOSIS — E119 Type 2 diabetes mellitus without complications: Secondary | ICD-10-CM

## 2016-04-18 NOTE — Assessment & Plan Note (Signed)
Recently admitted for neurological symptoms suggestive of stroke. She had a negative workup. She is taking her medications as prescribed. She is to continue aspirin, lipitor.

## 2016-04-18 NOTE — Progress Notes (Signed)
Pre visit review using our clinic review tool, if applicable. No additional management support is needed unless otherwise documented below in the visit note. 

## 2016-04-18 NOTE — Patient Instructions (Signed)
Continue your current medications as prescribed.  Please follow-up with Dr. Caryl Bis as scheduled or at least 3 months from now.  Continue to log your sugars.  Take care  Dr. Lacinda Axon

## 2016-04-18 NOTE — Assessment & Plan Note (Signed)
Blood sugars are fairly well controlled. A1C 7.1.  Patient to continue metformin 2000 mg total daily and glipizide 4 mg BID. Follow up in 3 months for A1C.

## 2016-04-18 NOTE — Progress Notes (Signed)
Subjective:  Patient ID: Tamara Summers, female    DOB: May 03, 1951  Age: 65 y.o. MRN: [id number]  CC: Hospital follow up  HPI:  65 year old female with a past medical history of hypertension, hyperlipidemia, DM 2 presents for hospital follow-up.  Patient was recently admitted on 6/2 - 6/4. Her hospital course was reviewed and is summarized as below.  Patient presented with severe headache, dizziness, and elevated blood sugar. She gone to an urgent care prior and there was documentation about extremity weakness. There was concern about underlying stroke. Initial CT scan was negative and she was admitted for further evaluation and workup. MRI was obtained and neurology was consulted. MRI was negative. Aspirin therapy was recommended in addition to treating her comorbidities. Her home diabetic regimen was changed to metformin 2000 mg total daily, and her glipizide was increased to twice a day. Her symptoms improved during her hospitalization and she was discharged home in stable condition.  She presents today for follow-up. She states that she is doing well. She does report elevated sugars. She has her log today. She endorses compliance with medications (including increased metformin and glipizide). No recent hypoglycemic episodes. Her fasting sugars have been predominantly below 200 since discharge. No other complaints today.  Social Hx   Social History   Social History  . Marital Status: Married    Spouse Name: N/A  . Number of Children: N/A  . Years of Education: N/A   Social History Main Topics  . Smoking status: Former Research scientist (life sciences)  . Smokeless tobacco: None  . Alcohol Use: 1.2 - 1.8 oz/week    2-3 Standard drinks or equivalent per week  . Drug Use: No  . Sexual Activity: Not Asked   Other Topics Concern  . None   Social History Narrative   Review of Systems  Endocrine:       Elevated blood sugars.  Neurological: Negative.    Objective:  BP 132/76 mmHg  Pulse 84  Temp(Src)  97.8 F (36.6 C) (Oral)  Ht 5\' 4"  (1.626 m)  Wt 140 lb 8 oz (63.73 kg)  BMI 24.10 kg/m2  SpO2 96%  BP/Weight 04/18/2016 [id number]  Systolic BP [id number] -  Diastolic BP 76 75 -  Wt. (Lbs) 140.5 - 140  BMI 24.1 - 24.81   Physical Exam  Constitutional: She is oriented to person, place, and time. She appears well-developed. No distress.  HENT:  Head: Normocephalic and atraumatic.  Cardiovascular: Normal rate and regular rhythm.   Pulmonary/Chest: Effort normal and breath sounds normal.  Neurological: She is alert and oriented to person, place, and time.  No focal deficits.  Psychiatric: She has a normal mood and affect.  Vitals reviewed.  Lab Results  Component Value Date   WBC 6.7 04/12/2016   HGB 12.6 04/12/2016   HCT 37.5 04/12/2016   PLT 248 04/12/2016   GLUCOSE 144* 04/12/2016   CHOL 149 04/11/2016   TRIG 217* 04/11/2016   HDL 53 04/11/2016   LDLCALC 53 04/11/2016   ALT 31 04/11/2016   AST 30 04/11/2016   NA 137 04/12/2016   K 3.8 04/12/2016   CL 102 04/12/2016   CREATININE 0.81 04/12/2016   BUN 17 04/12/2016   CO2 25 04/12/2016   INR 0.93 04/11/2016   HGBA1C 7.1* 04/11/2016   MICROALBUR <0.7 01/29/2016   Assessment & Plan:   Problem List Items Addressed This Visit    Diabetes mellitus type 2, controlled, without complications ([place])    Blood sugars are  fairly well controlled. A1C 7.1.  Patient to continue metformin 2000 mg total daily and glipizide 4 mg BID. Follow up in 3 months for A1C.      Stroke-like symptoms - Primary    Recently admitted for neurological symptoms suggestive of stroke. She had a negative workup. She is taking her medications as prescribed. She is to continue aspirin, lipitor.        Follow-up: PRN  [place]

## 2016-04-30 ENCOUNTER — Ambulatory Visit: Payer: 59 | Admitting: Family Medicine

## 2016-06-20 ENCOUNTER — Telehealth: Payer: Self-pay | Admitting: Family Medicine

## 2016-06-20 MED ORDER — ATORVASTATIN CALCIUM 40 MG PO TABS: 40.0000 mg | ORAL_TABLET | Freq: Every day | ORAL | 3 refills | Status: DC

## 2016-06-20 NOTE — Telephone Encounter (Signed)
Pt called stating she needs a refill for atorvastatin (LIPITOR) 40 MG tablet. Pt states pharmacy stated she needs to speak to provider before it can be refilled. Does pt need to come in?  Call pt @ [phone]. Thank you!

## 2016-06-20 NOTE — Telephone Encounter (Signed)
Please advise refill, this is a historical med in the computer, thanks

## 2016-06-20 NOTE — Telephone Encounter (Signed)
Refill sent to pharmacy.   

## 2016-07-04 ENCOUNTER — Other Ambulatory Visit
Admission: RE | Admit: 2016-07-04 | Discharge: 2016-07-04 | Disposition: A | Discharge status: Home / Self Care | Payer: 59 | Source: Ambulatory Visit | Attending: Student | Admitting: Student

## 2016-07-04 DIAGNOSIS — R197 Diarrhea, unspecified: Secondary | ICD-10-CM | POA: Diagnosis not present

## 2016-07-04 LAB — GASTROINTESTINAL PANEL BY PCR, STOOL (REPLACES STOOL CULTURE)
ADENOVIRUS F40/41: NOT DETECTED
Astrovirus: NOT DETECTED
CAMPYLOBACTER SPECIES: NOT DETECTED
CRYPTOSPORIDIUM: NOT DETECTED
CYCLOSPORA CAYETANENSIS: NOT DETECTED
E. COLI O157: NOT DETECTED
ENTEROPATHOGENIC E COLI (EPEC): NOT DETECTED
Entamoeba histolytica: NOT DETECTED
Enteroaggregative E coli (EAEC): NOT DETECTED
Enterotoxigenic E coli (ETEC): NOT DETECTED
Giardia lamblia: NOT DETECTED
Norovirus GI/GII: NOT DETECTED
PLESIMONAS SHIGELLOIDES: NOT DETECTED
ROTAVIRUS A: NOT DETECTED
SAPOVIRUS (I, II, IV, AND V): NOT DETECTED
SHIGA LIKE TOXIN PRODUCING E COLI (STEC): NOT DETECTED
SHIGELLA/ENTEROINVASIVE E COLI (EIEC): NOT DETECTED
Salmonella species: NOT DETECTED
VIBRIO SPECIES: NOT DETECTED
Vibrio cholerae: NOT DETECTED
YERSINIA ENTEROCOLITICA: NOT DETECTED

## 2016-07-04 LAB — C DIFFICILE QUICK SCREEN W PCR REFLEX
C DIFFICLE (CDIFF) ANTIGEN: NEGATIVE
C Diff interpretation: NOT DETECTED
C Diff toxin: NEGATIVE

## 2016-07-07 LAB — PANCREATIC ELASTASE, FECAL

## 2016-07-11 ENCOUNTER — Emergency Department
Admission: EM | Admit: 2016-07-11 | Discharge: 2016-07-12 | Disposition: A | Discharge status: Home / Self Care | Payer: 59 | Attending: Emergency Medicine | Admitting: Emergency Medicine

## 2016-07-11 ENCOUNTER — Encounter: Payer: Self-pay | Admitting: Emergency Medicine

## 2016-07-11 DIAGNOSIS — E119 Type 2 diabetes mellitus without complications: Secondary | ICD-10-CM | POA: Insufficient documentation

## 2016-07-11 DIAGNOSIS — Z79899 Other long term (current) drug therapy: Secondary | ICD-10-CM | POA: Diagnosis not present

## 2016-07-11 DIAGNOSIS — I10 Essential (primary) hypertension: Secondary | ICD-10-CM | POA: Diagnosis not present

## 2016-07-11 DIAGNOSIS — R748 Abnormal levels of other serum enzymes: Secondary | ICD-10-CM

## 2016-07-11 DIAGNOSIS — Z7982 Long term (current) use of aspirin: Secondary | ICD-10-CM | POA: Insufficient documentation

## 2016-07-11 DIAGNOSIS — Z87891 Personal history of nicotine dependence: Secondary | ICD-10-CM | POA: Diagnosis not present

## 2016-07-11 DIAGNOSIS — Z7984 Long term (current) use of oral hypoglycemic drugs: Secondary | ICD-10-CM | POA: Insufficient documentation

## 2016-07-11 DIAGNOSIS — Z791 Long term (current) use of non-steroidal anti-inflammatories (NSAID): Secondary | ICD-10-CM | POA: Insufficient documentation

## 2016-07-11 DIAGNOSIS — R112 Nausea with vomiting, unspecified: Secondary | ICD-10-CM | POA: Diagnosis not present

## 2016-07-11 DIAGNOSIS — J45909 Unspecified asthma, uncomplicated: Secondary | ICD-10-CM | POA: Diagnosis not present

## 2016-07-11 DIAGNOSIS — R945 Abnormal results of liver function studies: Secondary | ICD-10-CM | POA: Insufficient documentation

## 2016-07-11 DIAGNOSIS — R1010 Upper abdominal pain, unspecified: Secondary | ICD-10-CM | POA: Insufficient documentation

## 2016-07-11 LAB — BASIC METABOLIC PANEL
Anion gap: 10 (ref 5–15)
BUN: 13 mg/dL (ref 6–20)
CHLORIDE: 101 mmol/L (ref 101–111)
CO2: 25 mmol/L (ref 22–32)
CREATININE: 0.58 mg/dL (ref 0.44–1.00)
Calcium: 9.5 mg/dL (ref 8.9–10.3)
GFR calc Af Amer: >60 mL/min (ref >60)
GFR calc non Af Amer: >60 mL/min (ref >60)
Glucose, Bld: 192 mg/dL — ABNORMAL HIGH (ref 65–99)
POTASSIUM: 3.5 mmol/L (ref 3.5–5.1)
SODIUM: 136 mmol/L (ref 135–145)

## 2016-07-11 LAB — CBC
HEMATOCRIT: 36.7 % (ref 35.0–47.0)
Hemoglobin: 12.9 g/dL (ref 12.0–16.0)
MCH: 31 pg (ref 26.0–34.0)
MCHC: 35.2 g/dL (ref 32.0–36.0)
MCV: 87.9 fL (ref 80.0–100.0)
PLATELETS: 299 10*3/uL (ref 150–440)
RBC: 4.17 MIL/uL (ref 3.80–5.20)
RDW: 14.1 % (ref 11.5–14.5)
WBC: 8.5 10*3/uL (ref 3.6–11.0)

## 2016-07-11 LAB — TROPONIN I: Troponin I: <0.03 ng/mL (ref <0.03)

## 2016-07-11 NOTE — ED Triage Notes (Signed)
Pt states "months" of epigastric pain. Pt states tonight pain is worse and has been accompanied by vomiting. Pt with pwd skin. Pt denies shob, diaphoreis, jaw pain, neck pain. Pt states pain does radiate to back.

## 2016-07-12 ENCOUNTER — Emergency Department: Payer: 59

## 2016-07-12 LAB — LIPASE, BLOOD: LIPASE: 81 U/L — AB (ref 11–51)

## 2016-07-12 LAB — HEPATIC FUNCTION PANEL
ALK PHOS: 292 U/L — AB (ref 38–126)
ALT: 467 U/L — AB (ref 14–54)
AST: 350 U/L — ABNORMAL HIGH (ref 15–41)
Albumin: 3.8 g/dL (ref 3.5–5.0)
BILIRUBIN INDIRECT: 0.4 mg/dL (ref 0.3–0.9)
BILIRUBIN TOTAL: 1.2 mg/dL (ref 0.3–1.2)
Bilirubin, Direct: 0.8 mg/dL — ABNORMAL HIGH (ref 0.1–0.5)
Total Protein: 7.3 g/dL (ref 6.5–8.1)

## 2016-07-12 MED ORDER — IOPAMIDOL (ISOVUE-300) INJECTION 61%
30.0000 mL | Freq: Once | INTRAVENOUS | Status: AC | PRN
  Administered 2016-07-12: 30 mL via ORAL

## 2016-07-12 MED ORDER — FAMOTIDINE IN NACL 20-0.9 MG/50ML-% IV SOLN
20.0000 mg | Freq: Once | INTRAVENOUS | Status: AC
Start: 2016-07-12 — End: 2016-07-12
  Administered 2016-07-12: 20 mg via INTRAVENOUS
  Filled 2016-07-12: qty 50

## 2016-07-12 MED ORDER — MORPHINE SULFATE (PF) 4 MG/ML IV SOLN
4.0000 mg | Freq: Once | INTRAVENOUS | Status: AC
  Administered 2016-07-12: 4 mg via INTRAVENOUS
  Filled 2016-07-12: qty 1

## 2016-07-12 MED ORDER — DICYCLOMINE HCL 20 MG PO TABS
20.0000 mg | ORAL_TABLET | Freq: Once | ORAL | Status: AC
  Administered 2016-07-12: 20 mg via ORAL
  Filled 2016-07-12: qty 1

## 2016-07-12 MED ORDER — DICYCLOMINE HCL 20 MG PO TABS: 20.0000 mg | ORAL_TABLET | Freq: Four times a day (QID) | ORAL | 0 refills | Status: DC | PRN

## 2016-07-12 MED ORDER — ONDANSETRON HCL 4 MG/2ML IJ SOLN
4.0000 mg | Freq: Once | INTRAMUSCULAR | Status: AC
  Administered 2016-07-12: 4 mg via INTRAVENOUS
  Filled 2016-07-12: qty 2

## 2016-07-12 MED ORDER — IOPAMIDOL (ISOVUE-300) INJECTION 61%
100.0000 mL | Freq: Once | INTRAVENOUS | Status: AC | PRN
  Administered 2016-07-12: 100 mL via INTRAVENOUS

## 2016-07-12 MED ORDER — SODIUM CHLORIDE 0.9 % IV BOLUS (SEPSIS)
1000.0000 mL | Freq: Once | INTRAVENOUS | Status: AC
  Administered 2016-07-12: 1000 mL via INTRAVENOUS

## 2016-07-12 NOTE — ED Notes (Signed)
Patient transported to CT 

## 2016-07-12 NOTE — ED Provider Notes (Signed)
[facility] Emergency Department Provider Note   ____________________________________________   First MD Initiated Contact with Patient 07/12/16 0036     (approximate)  I have reviewed the triage vital signs and the nursing notes.   HISTORY  Chief Complaint Abdominal Pain and Emesis    HPI Tamara Summers is a 65 y.o. female who presents to the ED from home with a chief complain of abdominal pain, nausea and vomiting. Patient reports a several week history of diarrhea. She had laboratory tests and stool cultures done by her GI doctor which have been normal. Reports stools have been firming up. Yesterday she developed upper abdominal pain/burning accompanied by vomiting. Denies associated fever, chills, chest pain, shortness of breath, dysuria. Denies recent travel or trauma. Nothing makes her symptoms better or worse.   Past Medical History:  Diagnosis Date  . Allergic rhinitis   . Asthma   . Chickenpox   . Diabetes mellitus without complication ([place])   . Diverticulitis   . GERD (gastroesophageal reflux disease)   . Headache   . History of blood transfusion   . Hyperlipidemia   . Hypertension   . TIA (transient ischemic attack)   . Urinary retention     Patient Active Problem List   Diagnosis Date Noted  . Stroke-like symptoms 04/18/2016  . Weakness 04/11/2016  . Mouth ulcer 03/14/2016  . Abdominal pain, left lower quadrant 03/11/2016  . Vision changes 01/29/2016  . Hyperlipidemia 01/01/2016  . Essential hypertension 11/29/2015  . Anemia 10/29/2015  . Shortness of breath 10/29/2015  . Increased heart rate 10/29/2015  . Diabetes mellitus type 2, controlled, without complications ([place]) 0000000    Past Surgical History:  Procedure Laterality Date  . ABDOMINAL HYSTERECTOMY  1987  . CHOLECYSTECTOMY  1988    Prior to Admission medications   Medication Sig Start Date End Date Taking? Authorizing Provider  albuterol (PROVENTIL  HFA;VENTOLIN HFA) 108 (90 BASE) MCG/ACT inhaler Inhale 2 puffs into the lungs every 6 (six) hours as needed for wheezing or shortness of breath. 10/18/15  Yes Orbie Pyo, MD  aspirin EC 325 MG tablet Take 1 tablet (325 mg total) by mouth daily. 04/12/16  Yes Dustin Flock, MD  atorvastatin (LIPITOR) 40 MG tablet Take 1 tablet (40 mg total) by mouth daily. 06/20/16  Yes Leone Haven, MD  cholecalciferol (VITAMIN D) 1000 units tablet Take 2,000 Units by mouth daily.   Yes Historical Provider, MD  dexlansoprazole (DEXILANT) 60 MG capsule Take 60 mg by mouth daily.   Yes Historical Provider, MD  escitalopram (LEXAPRO) 10 MG tablet Take 10 mg by mouth daily.   Yes Historical Provider, MD  glimepiride (AMARYL) 2 MG tablet Take 2 mg by mouth daily. 04/29/16  Yes Historical Provider, MD  ibuprofen (ADVIL,MOTRIN) 200 MG tablet Take 400 mg by mouth every 6 (six) hours as needed for headache or mild pain.   Yes Historical Provider, MD  metFORMIN (GLUCOPHAGE-XR) 500 MG 24 hr tablet Take 4 tablets (2,000 mg total) by mouth daily with breakfast. 04/12/16  Yes Dustin Flock, MD  metoprolol succinate (TOPROL-XL) 25 MG 24 hr tablet Take 25 mg by mouth daily.   Yes Historical Provider, MD  sucralfate (CARAFATE) 1 g tablet Take 1 tablet by mouth 4 (four) times daily. 07/09/16  Yes Historical Provider, MD  dicyclomine (BENTYL) 20 MG tablet Take 1 tablet (20 mg total) by mouth every 6 (six) hours as needed. 07/12/16   Paulette Blanch, MD  hydrochlorothiazide (HYDRODIURIL)  12.5 MG tablet Take 1 tablet (12.5 mg total) by mouth daily. 11/30/15 05/28/16  Leone Haven, MD    Allergies Penicillins and Sulfa antibiotics  Family History  Problem Relation Age of Onset  . Hyperlipidemia      Parent  . Heart disease      Parent  . Hypertension      Parent  . Stroke      Grandparent  . Mental illness Sister     Social History Social History  Substance Use Topics  . Smoking status: Former Research scientist (life sciences)  .  Smokeless tobacco: Never Used  . Alcohol use 1.2 - 1.8 oz/week    2 - 3 Standard drinks or equivalent per week    Review of Systems  Constitutional: No fever/chills. Eyes: No visual changes. ENT: No sore throat. Cardiovascular: Denies chest pain. Respiratory: Denies shortness of breath. Gastrointestinal: Positive for abdominal pain, nausea and vomiting.  No diarrhea.  No constipation. Genitourinary: Negative for dysuria. Musculoskeletal: Negative for back pain. Skin: Negative for rash. Neurological: Negative for headaches, focal weakness or numbness.  10-point ROS otherwise negative.  ____________________________________________   PHYSICAL EXAM:  VITAL SIGNS: ED Triage Vitals [07/11/16 2204]  Enc Vitals Group     BP (!) 157/93     Pulse Rate (!) 106     Resp 20     Temp      Temp Source Oral     SpO2 100 %     Weight 137 lb (62.1 kg)     Height 5\' 3"  (1.6 m)     Head Circumference      Peak Flow      Pain Score 8     Pain Loc      Pain Edu?      Excl. in [place]?     Constitutional: Alert and oriented. Well appearing and in mild acute distress. Eyes: Conjunctivae are normal. PERRL. EOMI. Head: Atraumatic. Nose: No congestion/rhinnorhea. Mouth/Throat: Mucous membranes are moist.  Oropharynx non-erythematous. Neck: No stridor.   Cardiovascular: Normal rate, regular rhythm. Grossly normal heart sounds.  Good peripheral circulation. Respiratory: Normal respiratory effort.  No retractions. Lungs CTAB. Gastrointestinal: Soft and mildly tender tender to palpation upper abdomen without rebound or guarding. No distention. No abdominal bruits. No CVA tenderness. Musculoskeletal: No lower extremity tenderness nor edema.  No joint effusions. Neurologic:  Normal speech and language. No gross focal neurologic deficits are appreciated.  Skin:  Skin is warm, dry and intact. No rash noted. Psychiatric: Mood and affect are normal. Speech and behavior are  normal.  ____________________________________________   LABS (all labs ordered are listed, but only abnormal results are displayed)  Labs Reviewed  BASIC METABOLIC PANEL - Abnormal; Notable for the following:       Result Value   Glucose, Bld 192 (*)    All other components within normal limits  HEPATIC FUNCTION PANEL - Abnormal; Notable for the following:    AST 350 (*)    ALT 467 (*)    Alkaline Phosphatase 292 (*)    Bilirubin, Direct 0.8 (*)    All other components within normal limits  LIPASE, BLOOD - Abnormal; Notable for the following:    Lipase 81 (*)    All other components within normal limits  CBC  TROPONIN I   ____________________________________________  EKG  ED ECG REPORT I, Idus Rathke J, the attending physician, personally viewed and interpreted this ECG.   Date: 07/12/2016  EKG Time: 2205  Rate: 106  Rhythm:  sinus tachycardia  Axis: Normal  Intervals:none  ST&T Change: Nonspecific  ____________________________________________  RADIOLOGY  CT abdomen/pelvis interpreted per Dr. Jeannine Boga: 1. Interval development of biliary and pancreatic ductal dilatation.  While these findings may be related to underlying stricture, there  is question of vague hypo enhancement at the pancreatic head/  uncinate process, raising the possibility for underlying pancreatic  mass. Further evaluation with dedicated MRI/MRCP would likely be  helpful for further evaluation.  2. Few small peripancreatic lymph nodes measuring up to 8-9 mm as  above. No other adenopathy within the abdomen and pelvis.  3. No other acute intra-abdominal or pelvic process.  4. Sigmoid diverticulosis without evidence for acute diverticulitis.  5. Hepatic steatosis.    ____________________________________________   PROCEDURES  Procedure(s) performed: None  Procedures  Critical Care performed: No  ____________________________________________   INITIAL IMPRESSION / ASSESSMENT AND PLAN  / ED COURSE  Pertinent labs & imaging results that were available during my care of the patient were reviewed by me and considered in my medical decision making (see chart for details).  65 year old female who presents with upper abdominal discomfort associated with nausea and vomiting. Laboratory results notable for elevated hepatic function as well as lipase. Patient states her lab work previously has shown elevated hepatic function; I do not see this in prior records. Will administer IV analgesia, edema and proceed with CT abdomen/pelvis.  Clinical Course  Comment By Time  Patient sleeping in no acute distress. Updated patient and spouse of CT imaging results which will require further evaluation by her GI doctor. She has an upcoming GI appointment in the next 2 weeks with Dr. Rayann Heman and will keep that appointment. Advised bland diet, will prescribe Bentyl as needed for discomfort. Strict return precautions given. Patient and spouse verbalize understanding and agree with plan of care. Paulette Blanch, MD 09/02 0440     ____________________________________________   FINAL CLINICAL IMPRESSION(S) / ED DIAGNOSES  Final diagnoses:  Pain of upper abdomen  Elevated liver enzymes      NEW MEDICATIONS STARTED DURING THIS VISIT:  New Prescriptions   DICYCLOMINE (BENTYL) 20 MG TABLET    Take 1 tablet (20 mg total) by mouth every 6 (six) hours as needed.     Note:  This document was prepared using Dragon voice recognition software and may include unintentional dictation errors.    Paulette Blanch, MD 07/12/16 [phone]

## 2016-07-12 NOTE — Discharge Instructions (Signed)
1. You may take Bentyl as needed for abdominal discomfort. 2. Your liver enzymes were elevated this morning. CT scan shows some dilation of your liver ducts which will require further evaluation by your GI doctor. 3. Return to the ER for worsening symptoms, persistent vomiting, difficulty breathing or other concerns.

## 2016-07-15 ENCOUNTER — Other Ambulatory Visit: Payer: Self-pay | Admitting: Student

## 2016-07-15 ENCOUNTER — Encounter: Payer: Self-pay | Admitting: Family Medicine

## 2016-07-15 ENCOUNTER — Ambulatory Visit (INDEPENDENT_AMBULATORY_CARE_PROVIDER_SITE_OTHER): Payer: 59 | Admitting: Family Medicine

## 2016-07-15 VITALS — BP 132/86 | HR 93 | Temp 97.8°F | Wt 137.4 lb

## 2016-07-15 DIAGNOSIS — E119 Type 2 diabetes mellitus without complications: Secondary | ICD-10-CM | POA: Diagnosis not present

## 2016-07-15 DIAGNOSIS — R7989 Other specified abnormal findings of blood chemistry: Secondary | ICD-10-CM | POA: Diagnosis not present

## 2016-07-15 DIAGNOSIS — K8689 Other specified diseases of pancreas: Secondary | ICD-10-CM

## 2016-07-15 DIAGNOSIS — R945 Abnormal results of liver function studies: Principal | ICD-10-CM

## 2016-07-15 LAB — POCT URINALYSIS DIPSTICK
Glucose, UA: 500
Ketones, UA: NEGATIVE
Leukocytes, UA: NEGATIVE
Nitrite, UA: NEGATIVE
PH UA: 5.5
PROTEIN UA: NEGATIVE
SPEC GRAV UA: 1.02
UROBILINOGEN UA: 0.2

## 2016-07-15 MED ORDER — ONDANSETRON HCL 4 MG PO TABS: 4.0000 mg | ORAL_TABLET | Freq: Three times a day (TID) | ORAL | 0 refills | Status: DC | PRN

## 2016-07-15 NOTE — Patient Instructions (Addendum)
Nice to see you. We will check some lab work today and call you with the results. You will see GI this Friday. You can try the Zofran for nausea. If you develop worsening abdominal pain, intractable nausea or vomiting, blood in her vomit, blood in your stool, jaundice, or any new or changing symptoms please seek medical attention.

## 2016-07-15 NOTE — Progress Notes (Signed)
  [name], MD Phone: [phone]  Tamara Summers is a 65 y.o. female who presents today for follow-up.  Patient was seen in the ED for days ago for nausea and vomiting and abdominal pain. She notes epigastric abdominal discomfort. Had had prior diarrhea for about 4 weeks though her stools have firmed up. Had lab work and stool cultures through her GI physician's office. It appears that her alkaline phosphatase was slightly elevated at that time. In the ED she had workup revealing elevated AST, ALT, and alkaline phosphatase. Lipase is mildly elevated. CT scan revealed dilatation of the common bile duct and pancreatic duct with potential mass in the pancreatic head and uncinate process read as hypoattenuation. She was discharged to follow-up with me and her GI physician. She reports continued intermittent epigastric discomfort. She notes nausea and vomiting have continued. Stools have firmed up. Stools are pale yellow. Urine is slightly darker than normal. She has not noticed any jaundice. She is status post cholecystectomy. Her GI appointment is on 08/07/16.  PMH: Former smoker   ROS see history of present illness  Objective  Physical Exam Vitals:   07/15/16 1113  BP: 132/86  Pulse: 93  Temp: 97.8 F (36.6 C)    BP Readings from Last 3 Encounters:  07/15/16 132/86  07/12/16 131/81  04/18/16 132/76   Wt Readings from Last 3 Encounters:  07/15/16 137 lb 6.4 oz (62.3 kg)  07/11/16 137 lb (62.1 kg)  04/18/16 140 lb 8 oz (63.7 kg)    Physical Exam  Constitutional: No distress.  Eyes: No scleral icterus.  Cardiovascular: Normal rate, regular rhythm and normal heart sounds.   Pulmonary/Chest: Effort normal and breath sounds normal.  Abdominal: Soft. Bowel sounds are normal. She exhibits no distension. There is tenderness (mild epigastric soreness). There is no rebound and no guarding.  Neurological: She is alert. Gait normal.  Skin: Skin is warm and dry. She is not  diaphoretic.     Assessment/Plan: Please see individual problem list.  Elevated LFTs Patient's LFTs significantly elevated with mildly elevated lipase in the setting of epigastric discomfort status post cholecystectomy. CT scan revealed possible mass in the pancreatic head and uncinate process. Stools might be slightly lighter than normal and urine darker than normal. Concerning for pancreatic cancer versus stricture of the bile duct. We'll recheck a CMP today and lipase. We're able to get her GI appointment moved up to 07/18/16 at 11:30 AM. She is given return precautions.  We will additionally check an A1c for diabetes today.  Orders Placed This Encounter  Procedures  . Comprehensive metabolic panel  . Lipase  . HgB A1c  . Ambulatory referral to Gastroenterology    Referral Priority:   Routine    Referral Type:   Consultation    Referral Reason:   Specialty Services Required    Number of Visits Requested:   1  . POCT Urinalysis Dipstick    Meds ordered this encounter  Medications  . ondansetron (ZOFRAN) 4 MG tablet    Sig: Take 1 tablet (4 mg total) by mouth every 8 (eight) hours as needed for nausea or vomiting.    Dispense:  20 tablet    Refill:  0   [name], MD [place]

## 2016-07-15 NOTE — Progress Notes (Signed)
Pre visit review using our clinic review tool, if applicable. No additional management support is needed unless otherwise documented below in the visit note. 

## 2016-07-15 NOTE — Assessment & Plan Note (Addendum)
Patient's LFTs significantly elevated with mildly elevated lipase in the setting of epigastric discomfort status post cholecystectomy. CT scan revealed possible mass in the pancreatic head and uncinate process. Stools might be slightly lighter than normal and urine darker than normal. Concerning for pancreatic cancer versus stricture of the bile duct. We'll recheck a CMP today and lipase. We're able to get her GI appointment moved up to 07/18/16 at 11:30 AM. She is given return precautions.

## 2016-07-16 ENCOUNTER — Other Ambulatory Visit: Payer: Self-pay | Admitting: Student

## 2016-07-16 ENCOUNTER — Ambulatory Visit
Admission: RE | Admit: 2016-07-16 | Discharge: 2016-07-16 | Disposition: A | Discharge status: Home / Self Care | Payer: 59 | Source: Ambulatory Visit | Attending: Student | Admitting: Student

## 2016-07-16 DIAGNOSIS — K8689 Other specified diseases of pancreas: Secondary | ICD-10-CM

## 2016-07-16 DIAGNOSIS — K760 Fatty (change of) liver, not elsewhere classified: Secondary | ICD-10-CM | POA: Insufficient documentation

## 2016-07-16 DIAGNOSIS — K831 Obstruction of bile duct: Secondary | ICD-10-CM | POA: Insufficient documentation

## 2016-07-16 LAB — COMPREHENSIVE METABOLIC PANEL
ALBUMIN: 4.6 g/dL (ref 3.6–4.8)
ALK PHOS: 667 IU/L — AB (ref 39–117)
ALT: 735 IU/L (ref 0–32)
AST: 620 IU/L (ref 0–40)
Albumin/Globulin Ratio: 1.8 (ref 1.2–2.2)
BILIRUBIN TOTAL: 3.9 mg/dL — AB (ref 0.0–1.2)
BUN / CREAT RATIO: 12 (ref 12–28)
BUN: 8 mg/dL (ref 8–27)
CHLORIDE: 97 mmol/L (ref 96–106)
CO2: 18 mmol/L (ref 18–29)
Calcium: 9.6 mg/dL (ref 8.7–10.3)
Creatinine, Ser: 0.66 mg/dL (ref 0.57–1.00)
GFR calc Af Amer: 109 mL/min/{1.73_m2} (ref >59)
GFR calc non Af Amer: 94 mL/min/{1.73_m2} (ref >59)
GLOBULIN, TOTAL: 2.6 g/dL (ref 1.5–4.5)
GLUCOSE: 284 mg/dL — AB (ref 65–99)
Potassium: 3.9 mmol/L (ref 3.5–5.2)
SODIUM: 137 mmol/L (ref 134–144)
Total Protein: 7.2 g/dL (ref 6.0–8.5)

## 2016-07-16 LAB — LIPASE: LIPASE: 118 U/L — AB (ref 0–59)

## 2016-07-16 LAB — HEMOGLOBIN A1C
Est. average glucose Bld gHb Est-mCnc: 189 mg/dL
Hgb A1c MFr Bld: 8.2 % — ABNORMAL HIGH (ref 4.8–5.6)

## 2016-07-16 MED ORDER — GADOBENATE DIMEGLUMINE 529 MG/ML IV SOLN
10.0000 mL | Freq: Once | INTRAVENOUS | Status: AC | PRN
  Administered 2016-07-16: 10 mL via INTRAVENOUS

## 2016-07-17 ENCOUNTER — Telehealth: Payer: Self-pay | Admitting: Family Medicine

## 2016-07-17 ENCOUNTER — Telehealth: Payer: Self-pay

## 2016-07-17 MED ORDER — ATORVASTATIN CALCIUM 40 MG PO TABS: 40.0000 mg | ORAL_TABLET | Freq: Every day | ORAL | 3 refills | Status: DC

## 2016-07-17 MED ORDER — GLIMEPIRIDE 2 MG PO TABS: 2.0000 mg | ORAL_TABLET | Freq: Every day | ORAL | 3 refills | Status: DC

## 2016-07-17 MED ORDER — GLIMEPIRIDE 2 MG PO TABS: 2.0000 mg | ORAL_TABLET | Freq: Every day | ORAL | 0 refills | Status: AC

## 2016-07-17 NOTE — Telephone Encounter (Signed)
Refill sent to pharmacy.   

## 2016-07-17 NOTE — Telephone Encounter (Signed)
Pt is returning office call. She had labs done and she also wants to give some info about another Dr's appt she had. Please call her at [phone].

## 2016-07-17 NOTE — Telephone Encounter (Signed)
  Oncology Nurse Navigator Documentation  Navigator Location: [facility] (07/17/16 0900) Navigator Encounter Type: Telephone (07/17/16 0900) Telephone: Tamara Summers Call (07/17/16 0900)             Barriers/Navigation Needs: Coordination of Care (07/17/16 0900)   Interventions: Coordination of Care (07/17/16 0900)   Coordination of Care: EUS (07/17/16 0900)        Acuity: Level 2 (07/17/16 0900)   Acuity Level 2: Initial guidance, education and coordination as needed;Educational needs;Assistance expediting appointments;Ongoing guidance and education throughout treatment as needed (07/17/16 0900)     Time Spent with Patient: 30 (07/17/16 0900)   Received STAT referral for upper EUS for possible pancreatic neoplasm. Voicemail left for [name] to return call. Will need to be referred to [facility] for stat order due to scheduling unavailability at [facility]. Information sent to [facility] for scheduling.

## 2016-07-17 NOTE — Telephone Encounter (Signed)
I spoke with the patient, she saw GI yesterday afternoon and has an appt tomorrow am at [facility] have the endoscopy.  She was very appreciative of what you all did to get her seen quickly.   She requested some refills on Glimepiride a 30 day to the local [facility] and a 90 day to mail order and a 90 day of atovastatin to the mail order, please advise, thanks  States if jamie needs anything to call her back, thanks

## 2016-07-17 NOTE — Progress Notes (Signed)
  Oncology Nurse Navigator Documentation  Navigator Location: [facility] (07/17/16 1000) Navigator Encounter Type: Letter/Fax/Email (07/17/16 1000)                       Coordination of Care: EUS (07/17/16 1000)                  Time Spent with Patient: 15 (07/17/16 1000)   EUS has been scheduled at [facility] 07/18/16 at 12:30 with Dr. Francella Solian.

## 2016-07-21 ENCOUNTER — Ambulatory Visit: Payer: 59 | Admitting: Family Medicine

## 2016-08-11 ENCOUNTER — Ambulatory Visit: Payer: 59 | Admitting: Family Medicine

## 2016-12-15 ENCOUNTER — Other Ambulatory Visit: Payer: Self-pay

## 2016-12-15 MED ORDER — HYDROCHLOROTHIAZIDE 12.5 MG PO TABS: 12.5000 mg | ORAL_TABLET | Freq: Every day | ORAL | 3 refills | Status: DC

## 2016-12-15 MED ORDER — ONETOUCH ULTRASOFT LANCETS MISC: 12 refills | Status: AC

## 2016-12-15 NOTE — Telephone Encounter (Signed)
Last OV 07/15/16 patient would like refill on one touch lancets  Hydrochlorothiazide last filled 11/30/15 90 3rf

## 2017-03-12 ENCOUNTER — Emergency Department
Admission: EM | Admit: 2017-03-12 | Discharge: 2017-03-12 | Payer: 59 | Attending: Emergency Medicine | Admitting: Emergency Medicine

## 2017-03-12 ENCOUNTER — Emergency Department: Payer: 59

## 2017-03-12 ENCOUNTER — Encounter: Payer: Self-pay | Admitting: Emergency Medicine

## 2017-03-12 DIAGNOSIS — J45909 Unspecified asthma, uncomplicated: Secondary | ICD-10-CM | POA: Diagnosis not present

## 2017-03-12 DIAGNOSIS — Z87891 Personal history of nicotine dependence: Secondary | ICD-10-CM | POA: Insufficient documentation

## 2017-03-12 DIAGNOSIS — R000 Tachycardia, unspecified: Secondary | ICD-10-CM | POA: Diagnosis not present

## 2017-03-12 DIAGNOSIS — R112 Nausea with vomiting, unspecified: Secondary | ICD-10-CM | POA: Diagnosis not present

## 2017-03-12 DIAGNOSIS — I10 Essential (primary) hypertension: Secondary | ICD-10-CM | POA: Diagnosis not present

## 2017-03-12 DIAGNOSIS — E119 Type 2 diabetes mellitus without complications: Secondary | ICD-10-CM | POA: Insufficient documentation

## 2017-03-12 DIAGNOSIS — Z79899 Other long term (current) drug therapy: Secondary | ICD-10-CM | POA: Insufficient documentation

## 2017-03-12 DIAGNOSIS — R1032 Left lower quadrant pain: Secondary | ICD-10-CM

## 2017-03-12 HISTORY — DX: Malignant (primary) neoplasm, unspecified: C80.1

## 2017-03-12 LAB — COMPREHENSIVE METABOLIC PANEL
ALT: 45 U/L (ref 14–54)
AST: 47 U/L — AB (ref 15–41)
Albumin: 3.1 g/dL — ABNORMAL LOW (ref 3.5–5.0)
Alkaline Phosphatase: 142 U/L — ABNORMAL HIGH (ref 38–126)
Anion gap: 8 (ref 5–15)
BUN: 15 mg/dL (ref 6–20)
CHLORIDE: 103 mmol/L (ref 101–111)
CO2: 24 mmol/L (ref 22–32)
Calcium: 8.6 mg/dL — ABNORMAL LOW (ref 8.9–10.3)
Creatinine, Ser: 0.51 mg/dL (ref 0.44–1.00)
Glucose, Bld: 203 mg/dL — ABNORMAL HIGH (ref 65–99)
POTASSIUM: 4.2 mmol/L (ref 3.5–5.1)
SODIUM: 135 mmol/L (ref 135–145)
Total Bilirubin: 0.4 mg/dL (ref 0.3–1.2)
Total Protein: 7.2 g/dL (ref 6.5–8.1)

## 2017-03-12 LAB — URINALYSIS, COMPLETE (UACMP) WITH MICROSCOPIC
Bacteria, UA: NONE SEEN
Bilirubin Urine: NEGATIVE
GLUCOSE, UA: 50 mg/dL — AB
Hgb urine dipstick: NEGATIVE
KETONES UR: NEGATIVE mg/dL
LEUKOCYTES UA: NEGATIVE
Nitrite: NEGATIVE
PH: 5 (ref 5.0–8.0)
Protein, ur: NEGATIVE mg/dL

## 2017-03-12 LAB — CBC
HCT: 32.4 % — ABNORMAL LOW (ref 35.0–47.0)
Hemoglobin: 10.8 g/dL — ABNORMAL LOW (ref 12.0–16.0)
MCH: 28.3 pg (ref 26.0–34.0)
MCHC: 33.4 g/dL (ref 32.0–36.0)
MCV: 84.7 fL (ref 80.0–100.0)
Platelets: 387 10*3/uL (ref 150–440)
RBC: 3.82 MIL/uL (ref 3.80–5.20)
RDW: 15.9 % — ABNORMAL HIGH (ref 11.5–14.5)
WBC: 11 10*3/uL (ref 3.6–11.0)

## 2017-03-12 LAB — LIPASE, BLOOD: LIPASE: 12 U/L (ref 11–51)

## 2017-03-12 LAB — GLUCOSE, CAPILLARY: GLUCOSE-CAPILLARY: 156 mg/dL — AB (ref 65–99)

## 2017-03-12 LAB — TROPONIN I

## 2017-03-12 LAB — LACTIC ACID, PLASMA: Lactic Acid, Venous: 1.1 mmol/L (ref 0.5–1.9)

## 2017-03-12 MED ORDER — SODIUM CHLORIDE 0.9 % IV BOLUS (SEPSIS)
1000.0000 mL | Freq: Once | INTRAVENOUS | Status: AC
  Administered 2017-03-12: 1000 mL via INTRAVENOUS

## 2017-03-12 MED ORDER — HYDROMORPHONE HCL 1 MG/ML IJ SOLN: 1.0000 mg | Freq: Once | INTRAMUSCULAR | Status: DC

## 2017-03-12 MED ORDER — IOPAMIDOL (ISOVUE-300) INJECTION 61%
100.0000 mL | Freq: Once | INTRAVENOUS | Status: AC | PRN
  Administered 2017-03-12: 100 mL via INTRAVENOUS

## 2017-03-12 MED ORDER — ONDANSETRON HCL 4 MG/2ML IJ SOLN
4.0000 mg | Freq: Once | INTRAMUSCULAR | Status: AC
  Administered 2017-03-12: 4 mg via INTRAVENOUS
  Filled 2017-03-12: qty 2

## 2017-03-12 MED ORDER — IOPAMIDOL (ISOVUE-300) INJECTION 61%
30.0000 mL | Freq: Once | INTRAVENOUS | Status: AC | PRN
  Administered 2017-03-12: 30 mL via ORAL

## 2017-03-12 MED ORDER — HYDROMORPHONE HCL 1 MG/ML IJ SOLN
1.0000 mg | Freq: Once | INTRAMUSCULAR | Status: AC
  Administered 2017-03-12 (×2): 1 mg via INTRAVENOUS
  Filled 2017-03-12: qty 1

## 2017-03-12 MED ORDER — HYDROMORPHONE HCL 1 MG/ML IJ SOLN
INTRAMUSCULAR | Status: AC
  Administered 2017-03-12: 1 mg via INTRAVENOUS
  Filled 2017-03-12: qty 1

## 2017-03-12 MED ORDER — PROMETHAZINE HCL 25 MG/ML IJ SOLN
25.0000 mg | Freq: Once | INTRAMUSCULAR | Status: AC
  Administered 2017-03-12: 25 mg via INTRAVENOUS
  Filled 2017-03-12: qty 1

## 2017-03-12 MED ORDER — HYDROMORPHONE HCL 1 MG/ML IJ SOLN
1.0000 mg | Freq: Once | INTRAMUSCULAR | Status: AC
  Administered 2017-03-12: 1 mg via INTRAVENOUS
  Filled 2017-03-12: qty 1

## 2017-03-12 NOTE — ED Notes (Signed)
CT called, pt not able to tolerate the whole first bottle of contrast, EDP aware. CT states they will come to get pt for CT.

## 2017-03-12 NOTE — ED Notes (Signed)
Pt offered BR assistance, pt reports not needing to use the bathroom at this time, pt verbalizes understanding of needing urine sample.

## 2017-03-12 NOTE — ED Provider Notes (Signed)
[facility] Emergency Department Provider Note   ____________________________________________   First MD Initiated Contact with Patient 03/12/17 [phone]     (approximate)  I have reviewed the triage vital signs and the nursing notes.   HISTORY  Chief Complaint Abdominal Pain    HPI Tamara Summers is a 66 y.o. female who presents to the ED from home with a chief complaint of abdominal pain. Patient has a history of pancreatic cancer, status post Whipple procedure in February with G-tube.Complains of a 15 hour history of left lower abdominal pain. Vomited once after arrival to the ED. Patient and spouse report baseline drainage of approximately 300 cc from her G-tube. She has endoscopy scheduled on 5/4 to switch her G-tube to J-tube.  Denies fever, chills, chest pain, shortness of breath, dysuria, diarrhea. Reports last bowel movement yesterday. Denies recent travel or trauma. Nothing makes her symptoms better or worse.   Past Medical History:  Diagnosis Date  . Allergic rhinitis   . Asthma   . Cancer ([place])   . Chickenpox   . Diabetes mellitus without complication ([place])   . Diverticulitis   . GERD (gastroesophageal reflux disease)   . Headache   . History of blood transfusion   . Hyperlipidemia   . Hypertension   . TIA (transient ischemic attack)   . Urinary retention     Patient Active Problem List   Diagnosis Date Noted  . Elevated LFTs 07/15/2016  . Stroke-like symptoms 04/18/2016  . Weakness 04/11/2016  . Mouth ulcer 03/14/2016  . Abdominal pain, left lower quadrant 03/11/2016  . Vision changes 01/29/2016  . Hyperlipidemia 01/01/2016  . Essential hypertension 11/29/2015  . Anemia 10/29/2015  . Shortness of breath 10/29/2015  . Increased heart rate 10/29/2015  . Diabetes mellitus type 2, controlled, without complications ([place]) 40/81/4481    Past Surgical History:  Procedure Laterality Date  . ABDOMINAL HYSTERECTOMY  1987  .  CHOLECYSTECTOMY  1988  Whipple G-tube  Prior to Admission medications   Medication Sig Start Date End Date Taking? Authorizing Provider  albuterol (PROVENTIL HFA;VENTOLIN HFA) 108 (90 BASE) MCG/ACT inhaler Inhale 2 puffs into the lungs every 6 (six) hours as needed for wheezing or shortness of breath. 10/18/15   Orbie Pyo, MD  aspirin EC 325 MG tablet Take 1 tablet (325 mg total) by mouth daily. 04/12/16   Dustin Flock, MD  atorvastatin (LIPITOR) 40 MG tablet Take 1 tablet (40 mg total) by mouth daily. 07/17/16   Leone Haven, MD  cholecalciferol (VITAMIN D) 1000 units tablet Take 2,000 Units by mouth daily.    Historical Provider, MD  dexlansoprazole (DEXILANT) 60 MG capsule Take 60 mg by mouth daily.    Historical Provider, MD  dicyclomine (BENTYL) 20 MG tablet Take 1 tablet (20 mg total) by mouth every 6 (six) hours as needed. 07/12/16   Paulette Blanch, MD  escitalopram (LEXAPRO) 10 MG tablet Take 10 mg by mouth daily.    Historical Provider, MD  glimepiride (AMARYL) 2 MG tablet Take 1 tablet (2 mg total) by mouth daily. 07/17/16   Leone Haven, MD  hydrochlorothiazide (HYDRODIURIL) 12.5 MG tablet Take 1 tablet (12.5 mg total) by mouth daily. 12/15/16 06/13/17  Leone Haven, MD  ibuprofen (ADVIL,MOTRIN) 200 MG tablet Take 400 mg by mouth every 6 (six) hours as needed for headache or mild pain.    Historical Provider, MD  Lancets [facility] ULTRASOFT) lancets Test once daily 12/15/16   Leone Haven,  MD  metFORMIN (GLUCOPHAGE-XR) 500 MG 24 hr tablet Take 4 tablets (2,000 mg total) by mouth daily with breakfast. 04/12/16   Dustin Flock, MD  metoprolol succinate (TOPROL-XL) 25 MG 24 hr tablet Take 25 mg by mouth daily.    Historical Provider, MD  ondansetron (ZOFRAN) 4 MG tablet Take 1 tablet (4 mg total) by mouth every 8 (eight) hours as needed for nausea or vomiting. 07/15/16   Leone Haven, MD  sucralfate (CARAFATE) 1 g tablet Take 1 tablet by mouth 4 (four) times daily.  07/09/16   Historical Provider, MD    Allergies Penicillins and Sulfa antibiotics  Family History  Problem Relation Age of Onset  . Hyperlipidemia      Parent  . Heart disease      Parent  . Hypertension      Parent  . Stroke      Grandparent  . Mental illness Sister     Social History Social History  Substance Use Topics  . Smoking status: Former Research scientist (life sciences)  . Smokeless tobacco: Never Used  . Alcohol use 1.2 - 1.8 oz/week    2 - 3 Standard drinks or equivalent per week    Review of Systems  Constitutional: No fever/chills. Eyes: No visual changes. ENT: No sore throat. Cardiovascular: Denies chest pain. Respiratory: Denies shortness of breath. Gastrointestinal: Positive for abdominal pain.  Positive for nausea and vomiting.  No diarrhea.  No constipation. Genitourinary: Negative for dysuria. Musculoskeletal: Negative for back pain. Skin: Negative for rash. Neurological: Negative for headaches, focal weakness or numbness.   ____________________________________________   PHYSICAL EXAM:  VITAL SIGNS: ED Triage Vitals [03/12/17 0244]  Enc Vitals Group     BP (!) 153/68     Pulse Rate (!) 117     Resp 20     Temp 98.4 F (36.9 C)     Temp Source Oral     SpO2 97 %     Weight 140 lb (63.5 kg)     Height 5\' 3"  (1.6 m)     Head Circumference      Peak Flow      Pain Score 10     Pain Loc      Pain Edu?      Excl. in [place]?     Constitutional: Alert and oriented. I'm comfortable appearing and in moderate acute distress. Eyes: Conjunctivae are normal. PERRL. EOMI. Head: Atraumatic. Nose: No congestion/rhinnorhea. Mouth/Throat: Mucous membranes are moist.  Oropharynx non-erythematous. Neck: No stridor.   Cardiovascular: Tachycardic rate, regular rhythm. Grossly normal heart sounds.  Good peripheral circulation. Respiratory: Normal respiratory effort.  No retractions. Lungs CTAB. Gastrointestinal: Soft and moderately tender to palpation diffusely, maximally and  left lower quadrant with voluntary guarding. Mild distention. No abdominal bruits. No CVA tenderness. Musculoskeletal: No lower extremity tenderness nor edema.  No joint effusions. Neurologic:  Normal speech and language. No gross focal neurologic deficits are appreciated.  Skin:  Skin is pale, warm, dry and intact. No rash noted. Psychiatric: Mood and affect are normal. Speech and behavior are normal.  ____________________________________________   LABS (all labs ordered are listed, but only abnormal results are displayed)  Labs Reviewed  COMPREHENSIVE METABOLIC PANEL - Abnormal; Notable for the following:       Result Value   Glucose, Bld 203 (*)    Calcium 8.6 (*)    Albumin 3.1 (*)    AST 47 (*)    Alkaline Phosphatase 142 (*)    All other components  within normal limits  CBC - Abnormal; Notable for the following:    Hemoglobin 10.8 (*)    HCT 32.4 (*)    RDW 15.9 (*)    All other components within normal limits  LIPASE, BLOOD  LACTIC ACID, PLASMA  TROPONIN I  URINALYSIS, COMPLETE (UACMP) WITH MICROSCOPIC   ____________________________________________  EKG  ED ECG REPORT I, Gussie Towson J, the attending physician, personally viewed and interpreted this ECG.   Date: 03/12/2017  EKG Time: 0415  Rate: 118  Rhythm: sinus tachycardia  Axis: Normal  Intervals:none  ST&T Change: Nonspecific  ____________________________________________  RADIOLOGY  Pending ____________________________________________   PROCEDURES  Procedure(s) performed: None  Procedures  Critical Care performed: Yes, see critical care note(s)   CRITICAL CARE Performed by: Paulette Blanch   Total critical care time: 45 minutes  Critical care time was exclusive of separately billable procedures and treating other patients.  Critical care was necessary to treat or prevent imminent or life-threatening deterioration.  Critical care was time spent personally by me on the following activities:  development of treatment plan with patient and/or surrogate as well as nursing, discussions with consultants, evaluation of patient's response to treatment, examination of patient, obtaining history from patient or surrogate, ordering and performing treatments and interventions, ordering and review of laboratory studies, ordering and review of radiographic studies, pulse oximetry and re-evaluation of patient's condition.  ____________________________________________   INITIAL IMPRESSION / ASSESSMENT AND PLAN / ED COURSE  Pertinent labs & imaging results that were available during my care of the patient were reviewed by me and considered in my medical decision making (see chart for details).  66 year old female with pancreatic cancer status post Whipple procedure with draining G-tube who presents with left lower quadrant abdominal pain associated with nausea and vomiting. She is quite uncomfortable on exam; well initiate IV fluid resuscitation, IV analgesia/antiemetic; obtain CT abdomen/pelvis to evaluate for intra-abdominal pathology.  Clinical Course as of Mar 12 716  Thu Mar 12, 2017  0174 Patient unable to drink more then half of the first bottle of contrast. Requesting additional pain medications.  [JS]  [phone] Patient reported feeling better after her second dose of IV Dilaudid and is attempting to drink additional contrast for optimal imaging study. She appears to be more comfortable. Tachycardia improving; now 106. Second liter IV fluids infusing. Will check lactate given patient's tachycardia.  [JS]  0715 Awaiting results of UA and CT scan. Care transferred to Dr. Mariea Clonts pending results and likely transfer to [facility].  [JS]    Clinical Course User Index [JS] Paulette Blanch, MD     ____________________________________________   FINAL CLINICAL IMPRESSION(S) / ED DIAGNOSES  Final diagnoses:  Left lower quadrant pain  Tachycardia      NEW MEDICATIONS STARTED DURING THIS VISIT:  New  Prescriptions   No medications on file     Note:  This document was prepared using Dragon voice recognition software and may include unintentional dictation errors.    Paulette Blanch, MD 03/12/17 [phone]

## 2017-03-12 NOTE — ED Notes (Signed)
At this time pt has consumed more contrast, pt does report feeling better post medication adm. Pt states "I am working on drinking the contrast."

## 2017-03-12 NOTE — ED Notes (Signed)
Patient transported to CT 

## 2017-03-12 NOTE — ED Notes (Signed)
EMS here to transport pt. 

## 2017-03-12 NOTE — ED Notes (Signed)
Currently awaiting to transfer pt to [facility]. Will complete transfer as soon as bed available. Family at bedside. Continue to monitor.

## 2017-03-12 NOTE — ED Notes (Signed)
Pt has consumed half of the first bottle of contrast at this time, pt given medication for pain and nausea. Will continue to monitor pt for intake of contrast post medication. EDP aware.

## 2017-03-12 NOTE — ED Notes (Addendum)
Per pt's husband, pt has appt at [facility] on Friday for confirmation of abd drainage tube placement on pt.

## 2017-03-12 NOTE — ED Provider Notes (Signed)
This patient was signed out to me by Dr. Lurline Hare, 66 year old patient status post Whipple for pancreatic cancer 2/18 presenting with left lower quadrant pain and nausea and vomiting. The patient has had CT imaging which does not show any acute complication in the abdomen. I have reexamined the patient, and she continues to have pain and nausea and feel poorly. She does have a heart rate which is persistently in the 110's, but states that this is baseline for her, and that she has had extensive cardiology workup which has not yielded any abnormal abnormality. She has been on metoprolol in the past but is not currently on this medication.  At this time, the patient has uncontrolled pain and nausea, and will require admission. I have contacted [facility] transfer center   Eula Listen, MD 03/12/17 [phone]

## 2017-03-12 NOTE — ED Notes (Signed)
Pt sleeping on stretcher, lights dimmed. Family at bedside. Family denies needs at this time.

## 2017-03-12 NOTE — ED Triage Notes (Addendum)
Pt in with co left sided abd since today, pt has drainage tube since February when she had a whipple. Pt has PICC line to right arm where she gets TPN but has been discontinued for an endoscopy Friday.

## 2017-03-12 NOTE — ED Notes (Signed)
Gave report to [place] at [facility]

## 2017-03-12 NOTE — ED Notes (Signed)
Pt reports gastrostomy tube on left abd is for drainage post surgery, pt uses PICC line on arm for TPN.

## 2017-03-12 NOTE — ED Notes (Signed)
Family notified that report has been called to [place], family given bed number, informed that [facility] has been called but we don't know when they will arrive to pick pt up. Pt asleep, family at bedside.

## 2017-04-24 ENCOUNTER — Encounter: Payer: Self-pay | Admitting: Medical Oncology

## 2017-04-24 ENCOUNTER — Emergency Department: Payer: 59

## 2017-04-24 ENCOUNTER — Emergency Department
Admission: EM | Admit: 2017-04-24 | Discharge: 2017-04-24 | Disposition: A | Discharge status: Home / Self Care | Payer: 59 | Attending: Emergency Medicine | Admitting: Emergency Medicine

## 2017-04-24 ENCOUNTER — Telehealth: Payer: Self-pay | Admitting: *Deleted

## 2017-04-24 DIAGNOSIS — Z79899 Other long term (current) drug therapy: Secondary | ICD-10-CM | POA: Insufficient documentation

## 2017-04-24 DIAGNOSIS — Z859 Personal history of malignant neoplasm, unspecified: Secondary | ICD-10-CM | POA: Insufficient documentation

## 2017-04-24 DIAGNOSIS — Z87891 Personal history of nicotine dependence: Secondary | ICD-10-CM | POA: Diagnosis not present

## 2017-04-24 DIAGNOSIS — I10 Essential (primary) hypertension: Secondary | ICD-10-CM | POA: Diagnosis not present

## 2017-04-24 DIAGNOSIS — E119 Type 2 diabetes mellitus without complications: Secondary | ICD-10-CM | POA: Diagnosis not present

## 2017-04-24 DIAGNOSIS — J45909 Unspecified asthma, uncomplicated: Secondary | ICD-10-CM | POA: Insufficient documentation

## 2017-04-24 DIAGNOSIS — R109 Unspecified abdominal pain: Secondary | ICD-10-CM | POA: Diagnosis not present

## 2017-04-24 LAB — URINALYSIS, COMPLETE (UACMP) WITH MICROSCOPIC
BILIRUBIN URINE: NEGATIVE
GLUCOSE, UA: NEGATIVE mg/dL
HGB URINE DIPSTICK: NEGATIVE
Ketones, ur: NEGATIVE mg/dL
NITRITE: NEGATIVE
Protein, ur: NEGATIVE mg/dL
Specific Gravity, Urine: 1.008 (ref 1.005–1.030)
pH: 6 (ref 5.0–8.0)

## 2017-04-24 LAB — COMPREHENSIVE METABOLIC PANEL
ALK PHOS: 185 U/L — AB (ref 38–126)
ALT: 49 U/L (ref 14–54)
ANION GAP: 9 (ref 5–15)
AST: 60 U/L — ABNORMAL HIGH (ref 15–41)
Albumin: 3.7 g/dL (ref 3.5–5.0)
BILIRUBIN TOTAL: 0.7 mg/dL (ref 0.3–1.2)
BUN: 15 mg/dL (ref 6–20)
CALCIUM: 9.4 mg/dL (ref 8.9–10.3)
CO2: 26 mmol/L (ref 22–32)
Chloride: 102 mmol/L (ref 101–111)
Creatinine, Ser: 0.63 mg/dL (ref 0.44–1.00)
Glucose, Bld: 204 mg/dL — ABNORMAL HIGH (ref 65–99)
Potassium: 3.5 mmol/L (ref 3.5–5.1)
SODIUM: 137 mmol/L (ref 135–145)
TOTAL PROTEIN: 7.5 g/dL (ref 6.5–8.1)

## 2017-04-24 LAB — CBC
HCT: 33.6 % — ABNORMAL LOW (ref 35.0–47.0)
HEMOGLOBIN: 11.2 g/dL — AB (ref 12.0–16.0)
MCH: 28.2 pg (ref 26.0–34.0)
MCHC: 33.4 g/dL (ref 32.0–36.0)
MCV: 84.3 fL (ref 80.0–100.0)
PLATELETS: 239 10*3/uL (ref 150–440)
RBC: 3.99 MIL/uL (ref 3.80–5.20)
RDW: 15.6 % — ABNORMAL HIGH (ref 11.5–14.5)
WBC: 7.6 10*3/uL (ref 3.6–11.0)

## 2017-04-24 LAB — LIPASE, BLOOD: Lipase: 14 U/L (ref 11–51)

## 2017-04-24 MED ORDER — ONDANSETRON HCL 4 MG/2ML IJ SOLN
4.0000 mg | Freq: Once | INTRAMUSCULAR | Status: AC
  Administered 2017-04-24: 4 mg via INTRAVENOUS
  Filled 2017-04-24: qty 2

## 2017-04-24 MED ORDER — TRAMADOL HCL 50 MG PO TABS: 50.0000 mg | ORAL_TABLET | Freq: Four times a day (QID) | ORAL | 0 refills | Status: DC | PRN

## 2017-04-24 MED ORDER — MORPHINE SULFATE (PF) 4 MG/ML IV SOLN
4.0000 mg | Freq: Once | INTRAVENOUS | Status: AC
  Administered 2017-04-24: 4 mg via INTRAVENOUS
  Filled 2017-04-24: qty 1

## 2017-04-24 MED ORDER — CYCLOBENZAPRINE HCL 5 MG PO TABS: 5.0000 mg | ORAL_TABLET | Freq: Three times a day (TID) | ORAL | 0 refills | Status: DC | PRN

## 2017-04-24 MED ORDER — SODIUM CHLORIDE 0.9 % IV BOLUS (SEPSIS)
1000.0000 mL | Freq: Once | INTRAVENOUS | Status: AC
  Administered 2017-04-24: 1000 mL via INTRAVENOUS

## 2017-04-24 MED ORDER — PROMETHAZINE HCL 25 MG/ML IJ SOLN
25.0000 mg | Freq: Once | INTRAMUSCULAR | Status: AC
  Administered 2017-04-24: 25 mg via INTRAVENOUS
  Filled 2017-04-24: qty 1

## 2017-04-24 NOTE — Telephone Encounter (Signed)
Patient requested to be worked in with Dr. Caryl Bis today. Pt has been up all night with back pain.  Pt contact [phone]

## 2017-04-24 NOTE — ED Triage Notes (Signed)
Pt reports left sided flank pain that began about 2 days ago. Denies fever, denies dysuria. Has had kidney stone in past. Hx of pancreatic CA.

## 2017-04-24 NOTE — Telephone Encounter (Signed)
Patient states she will go to walk in

## 2017-04-24 NOTE — ED Notes (Signed)
Pt called out requesting more phenergan, advised she had it less than 1 hour ago.  Pt denies nausea or vomiting at this time, pt appears mildly confused about meds.  Denies pain, Family remains at bedside.

## 2017-04-24 NOTE — Telephone Encounter (Signed)
Please advise 

## 2017-04-24 NOTE — ED Provider Notes (Signed)
[facility] Emergency Department Provider Note  Time seen: 9:59 AM  I have reviewed the triage vital signs and the nursing notes.   HISTORY  Chief Complaint Flank Pain    HPI Tamara Summers is a 66 y.o. female with a past medical history of diabetes, gastric reflux, hypertension, hyperlipidemia, TIA, pancreatic cancer status post Whipple procedure, not currently on radiation or chemotherapy treatments, who presents to the emergency department for left flank pain 2 days. According to the patient for the past 2 days she's had a moderate sharp feeling in her left back. She states a history of kidney stones but states it is been many many years since he does not recall what they felt like. Denies any dysuria or hematuria. States some nausea but states that is fairly normal for her. Denies any fever, chest pain, shortness of breath, diarrhea.  Past Medical History:  Diagnosis Date  . Allergic rhinitis   . Asthma   . Cancer ([place])   . Chickenpox   . Diabetes mellitus without complication ([place])   . Diverticulitis   . GERD (gastroesophageal reflux disease)   . Headache   . History of blood transfusion   . Hyperlipidemia   . Hypertension   . TIA (transient ischemic attack)   . Urinary retention     Patient Active Problem List   Diagnosis Date Noted  . Elevated LFTs 07/15/2016  . Stroke-like symptoms 04/18/2016  . Weakness 04/11/2016  . Mouth ulcer 03/14/2016  . Abdominal pain, left lower quadrant 03/11/2016  . Vision changes 01/29/2016  . Hyperlipidemia 01/01/2016  . Essential hypertension 11/29/2015  . Anemia 10/29/2015  . Shortness of breath 10/29/2015  . Increased heart rate 10/29/2015  . Diabetes mellitus type 2, controlled, without complications ([place]) 05/39/7673    Past Surgical History:  Procedure Laterality Date  . ABDOMINAL HYSTERECTOMY  1987  . ABDOMINAL HYSTERECTOMY    . CHOLECYSTECTOMY  1988    Prior to Admission medications    Medication Sig Start Date End Date Taking? Authorizing Provider  albuterol (PROVENTIL HFA;VENTOLIN HFA) 108 (90 BASE) MCG/ACT inhaler Inhale 2 puffs into the lungs every 6 (six) hours as needed for wheezing or shortness of breath. 10/18/15   Schaevitz, Randall An, MD  aspirin EC 325 MG tablet Take 1 tablet (325 mg total) by mouth daily. Patient not taking: Reported on 03/12/2017 04/12/16   Dustin Flock, MD  atorvastatin (LIPITOR) 40 MG tablet Take 1 tablet (40 mg total) by mouth daily. 07/17/16   Leone Haven, MD  dexlansoprazole (DEXILANT) 60 MG capsule Take 60 mg by mouth daily.    [provider]  dicyclomine (BENTYL) 20 MG tablet Take 1 tablet (20 mg total) by mouth every 6 (six) hours as needed. Patient taking differently: Take 20 mg by mouth daily.  07/12/16   Paulette Blanch, MD  escitalopram (LEXAPRO) 10 MG tablet Take 10 mg by mouth daily.    [provider]  glimepiride (AMARYL) 2 MG tablet Take 1 tablet (2 mg total) by mouth daily. Patient not taking: Reported on 03/12/2017 07/17/16   Leone Haven, MD  hydrochlorothiazide (HYDRODIURIL) 12.5 MG tablet Take 1 tablet (12.5 mg total) by mouth daily. 12/15/16 06/13/17  Leone Haven, MD  ibuprofen (ADVIL,MOTRIN) 200 MG tablet Take 400 mg by mouth every 6 (six) hours as needed for headache or mild pain.    [provider]  insulin lispro (HUMALOG) 100 UNIT/ML KiwkPen 2 units with meals + sliding scale as  directed; E11.65 02/07/17   [provider]  Lancets [facility] ULTRASOFT) lancets Test once daily 12/15/16   Leone Haven, MD  ondansetron (ZOFRAN) 4 MG tablet Take 1 tablet (4 mg total) by mouth every 8 (eight) hours as needed for nausea or vomiting. 07/15/16   Leone Haven, MD  Pancrelipase, Lip-Prot-Amyl, 24000-76000 units CPEP Take 2-3 capsules by mouth 3 (three) times daily. 2-3 caps tid with meals. Take 1 cap with snacks. 09/24/16   [provider]    Allergies  Allergen  Reactions  . Adhesive [Tape]   . Sulfa Antibiotics Hives    Family History  Problem Relation Age of Onset  . Hyperlipidemia Unknown        Parent  . Heart disease Unknown        Parent  . Hypertension Unknown        Parent  . Stroke Unknown        Grandparent  . Mental illness Sister     Social History Social History  Substance Use Topics  . Smoking status: Former Research scientist (life sciences)  . Smokeless tobacco: Never Used  . Alcohol use 1.2 - 1.8 oz/week    2 - 3 Standard drinks or equivalent per week    Review of Systems Constitutional: Negative for fever. Cardiovascular: Negative for chest pain. Respiratory: Negative for shortness of breath. Gastrointestinal: Negative for abdominal pain. Positive for nausea, negative for vomiting or diarrhea. Positive for left back pain Genitourinary: Negative for dysuria. Negative for hematuria Musculoskeletal: Moderate sharp left back pain Skin: Negative for rash. Neurological: Negative for headache All other ROS negative  ____________________________________________   PHYSICAL EXAM:  VITAL SIGNS: ED Triage Vitals  Enc Vitals Group     BP 04/24/17 0939 123/78     Pulse Rate 04/24/17 0939 (!) 102     Resp 04/24/17 0939 18     Temp 04/24/17 0939 98.4 F (36.9 C)     Temp Source 04/24/17 0939 Oral     SpO2 04/24/17 0939 97 %     Weight 04/24/17 0939 130 lb (59 kg)     Height 04/24/17 0939 5\' 3"  (1.6 m)     Head Circumference --      Peak Flow --      Pain Score 04/24/17 0938 8     Pain Loc --      Pain Edu? --      Excl. in [place]? --     Constitutional: Alert and oriented. Well appearing and in no distress. Eyes: Normal exam ENT   Head: Normocephalic and atraumatic   Mouth/Throat: Mucous membranes are moist. Cardiovascular: Normal rate, regular rhythm. No murmurs Respiratory: Normal respiratory effort without tachypnea nor retractions. Breath sounds are clear Gastrointestinal: Soft, mild upper abdominal discomfort which she  states is fairly chronic. G-tube present. No CVA tenderness. Musculoskeletal: Nontender with normal range of motion in all extremities. Neurologic:  Normal speech and language. No gross focal neurologic deficits Skin:  Skin is warm, dry and intact.  Psychiatric: Mood and affect are normal.   ____________________________________________    EKG  EKG reviewed and interpreted by myself shows sinus tachycardia 113 bpm, narrow QRS, normal axis, large and normal intervals with nonspecific ST changes.  ____________________________________________    RADIOLOGY  IMPRESSION: 1. No acute findings. No source for acute left-sided pain identified. No renal or ureteral calculi. No bowel obstruction. 2. Colonic diverticulosis without evidence of acute diverticulitis. 3. Mixed attenuation liver parenchyma, most likely patchy fatty infiltration. No circumscribed  mass or lesion identified although characterization is limited by the lack of intravascular contrast. 4. Surgical changes, as above. No evidence of surgical complicating feature. 5. Please note then an addendum on the CT abdomen report of 03/12/2017 described a possible enhancing metastasis within the right liver lobe versus fatty sparing for which MRI with and without contrast was recommended for further evaluation. 6. Aortic atherosclerosis.  ____________________________________________   INITIAL IMPRESSION / ASSESSMENT AND PLAN / ED COURSE  Pertinent labs & imaging results that were available during my care of the patient were reviewed by me and considered in my medical decision making (see chart for details).  Patient presents the emergency department for 2 days of left flank pain. We will check labs, CT scan and closely monitor. We will treat pain and nausea and IV hydrate while awaiting results.  Patient's CT is largely negative however does show a small liver lesion I discussed this with the patient and they have a pet CT  planned for this Thursday. Patient's urinalysis shows 6-30 whites with to numerous to count squamous cells, no urinary symptoms I will add a urine culture but we will hold off on treatment at this time. We will discharge husband states the patient has been experiencing intermittent back pain ever since being discharged from [facility] approximately one month ago which they are relating to more musculoskeletal type pains. We will discharge with a course of tramadol and Flexeril. Patient will follow-up with her doctor. I discussed return precautions for any worsening pain or fever. Patient is agreeable to plan. ____________________________________________   FINAL CLINICAL IMPRESSION(S) / ED DIAGNOSES  Left flank pain    Harvest Dark, MD 04/24/17 1333

## 2017-04-24 NOTE — Discharge Instructions (Signed)
As we discussed please follow-up with your doctor as soon as possible. Please take your medications as needed, as prescribed. Return to the emergency department for any increased pain or fever, or any other symptom personally concerning to yourself.

## 2017-04-24 NOTE — Telephone Encounter (Signed)
Unfortunately I will be unable to work her in today unless we have a cancellation. I would suggest evaluation at the walk in clinic at [facility] or an urgent care. Or you could offer an appointment with another provider with in [place]. Thanks.

## 2017-05-16 ENCOUNTER — Emergency Department: Payer: 59

## 2017-05-16 ENCOUNTER — Encounter: Payer: Self-pay | Admitting: Emergency Medicine

## 2017-05-16 ENCOUNTER — Other Ambulatory Visit: Payer: Self-pay | Admitting: Family Medicine

## 2017-05-16 ENCOUNTER — Emergency Department
Admission: EM | Admit: 2017-05-16 | Discharge: 2017-05-16 | Disposition: A | Discharge status: Other Acute Inpatient Hospital | Payer: 59 | Attending: Emergency Medicine | Admitting: Emergency Medicine

## 2017-05-16 DIAGNOSIS — Z794 Long term (current) use of insulin: Secondary | ICD-10-CM | POA: Diagnosis not present

## 2017-05-16 DIAGNOSIS — I10 Essential (primary) hypertension: Secondary | ICD-10-CM | POA: Insufficient documentation

## 2017-05-16 DIAGNOSIS — Z7982 Long term (current) use of aspirin: Secondary | ICD-10-CM | POA: Diagnosis not present

## 2017-05-16 DIAGNOSIS — R180 Malignant ascites: Secondary | ICD-10-CM

## 2017-05-16 DIAGNOSIS — E119 Type 2 diabetes mellitus without complications: Secondary | ICD-10-CM | POA: Insufficient documentation

## 2017-05-16 DIAGNOSIS — C259 Malignant neoplasm of pancreas, unspecified: Secondary | ICD-10-CM | POA: Insufficient documentation

## 2017-05-16 DIAGNOSIS — Z87891 Personal history of nicotine dependence: Secondary | ICD-10-CM | POA: Insufficient documentation

## 2017-05-16 DIAGNOSIS — C799 Secondary malignant neoplasm of unspecified site: Secondary | ICD-10-CM | POA: Insufficient documentation

## 2017-05-16 DIAGNOSIS — K529 Noninfective gastroenteritis and colitis, unspecified: Secondary | ICD-10-CM | POA: Diagnosis not present

## 2017-05-16 DIAGNOSIS — R188 Other ascites: Secondary | ICD-10-CM | POA: Diagnosis not present

## 2017-05-16 DIAGNOSIS — Z7984 Long term (current) use of oral hypoglycemic drugs: Secondary | ICD-10-CM | POA: Insufficient documentation

## 2017-05-16 DIAGNOSIS — R000 Tachycardia, unspecified: Secondary | ICD-10-CM | POA: Insufficient documentation

## 2017-05-16 DIAGNOSIS — K5904 Chronic idiopathic constipation: Secondary | ICD-10-CM | POA: Insufficient documentation

## 2017-05-16 DIAGNOSIS — R109 Unspecified abdominal pain: Secondary | ICD-10-CM | POA: Diagnosis present

## 2017-05-16 DIAGNOSIS — Z7951 Long term (current) use of inhaled steroids: Secondary | ICD-10-CM | POA: Diagnosis not present

## 2017-05-16 LAB — CBC WITH DIFFERENTIAL/PLATELET
BASOS ABS: 0.1 10*3/uL (ref 0–0.1)
Basophils Relative: 1 %
Eosinophils Absolute: 0.2 10*3/uL (ref 0–0.7)
Eosinophils Relative: 3 %
HEMATOCRIT: 33.9 % — AB (ref 35.0–47.0)
HEMOGLOBIN: 11.3 g/dL — AB (ref 12.0–16.0)
LYMPHS PCT: 20 %
Lymphs Abs: 1.2 10*3/uL (ref 1.0–3.6)
MCH: 27.8 pg (ref 26.0–34.0)
MCHC: 33.2 g/dL (ref 32.0–36.0)
MCV: 83.6 fL (ref 80.0–100.0)
Monocytes Absolute: 0.4 10*3/uL (ref 0.2–0.9)
Monocytes Relative: 7 %
NEUTROS ABS: 4.1 10*3/uL (ref 1.4–6.5)
Neutrophils Relative %: 69 %
Platelets: 237 10*3/uL (ref 150–440)
RBC: 4.06 MIL/uL (ref 3.80–5.20)
RDW: 16.6 % — ABNORMAL HIGH (ref 11.5–14.5)
WBC: 6 10*3/uL (ref 3.6–11.0)

## 2017-05-16 LAB — BODY FLUID CELL COUNT WITH DIFFERENTIAL
Eos, Fluid: 0 %
LYMPHS FL: 56 %
MONOCYTE-MACROPHAGE-SEROUS FLUID: 36 %
Neutrophil Count, Fluid: 8 %
Total Nucleated Cell Count, Fluid: 193 cu mm

## 2017-05-16 LAB — COMPREHENSIVE METABOLIC PANEL
ALT: 181 U/L — AB (ref 14–54)
AST: 202 U/L — AB (ref 15–41)
Albumin: 3.1 g/dL — ABNORMAL LOW (ref 3.5–5.0)
Alkaline Phosphatase: 369 U/L — ABNORMAL HIGH (ref 38–126)
Anion gap: 11 (ref 5–15)
BILIRUBIN TOTAL: 1.6 mg/dL — AB (ref 0.3–1.2)
BUN: 5 mg/dL — AB (ref 6–20)
CHLORIDE: 104 mmol/L (ref 101–111)
CO2: 24 mmol/L (ref 22–32)
CREATININE: 0.58 mg/dL (ref 0.44–1.00)
Calcium: 8.8 mg/dL — ABNORMAL LOW (ref 8.9–10.3)
GFR calc Af Amer: >60 mL/min (ref >60)
Glucose, Bld: 179 mg/dL — ABNORMAL HIGH (ref 65–99)
Potassium: 3 mmol/L — ABNORMAL LOW (ref 3.5–5.1)
Sodium: 139 mmol/L (ref 135–145)
Total Protein: 6.6 g/dL (ref 6.5–8.1)

## 2017-05-16 LAB — GRAM STAIN

## 2017-05-16 LAB — LACTIC ACID, PLASMA: Lactic Acid, Venous: 1.2 mmol/L (ref 0.5–1.9)

## 2017-05-16 LAB — LIPASE, BLOOD: LIPASE: 15 U/L (ref 11–51)

## 2017-05-16 MED ORDER — LIDOCAINE HCL (PF) 1 % IJ SOLN
5.0000 mL | Freq: Once | INTRAMUSCULAR | Status: AC
  Administered 2017-05-16: 5 mL via INTRADERMAL

## 2017-05-16 MED ORDER — PROMETHAZINE HCL 25 MG/ML IJ SOLN
12.5000 mg | Freq: Once | INTRAMUSCULAR | Status: AC
  Administered 2017-05-16: 12.5 mg via INTRAVENOUS
  Filled 2017-05-16: qty 1

## 2017-05-16 MED ORDER — LIDOCAINE HCL (PF) 1 % IJ SOLN
INTRAMUSCULAR | Status: AC
  Administered 2017-05-16: 5 mL via INTRADERMAL
  Filled 2017-05-16: qty 5

## 2017-05-16 MED ORDER — MORPHINE SULFATE (PF) 2 MG/ML IV SOLN
2.0000 mg | Freq: Once | INTRAVENOUS | Status: AC
  Administered 2017-05-16: 2 mg via INTRAVENOUS
  Filled 2017-05-16: qty 1

## 2017-05-16 MED ORDER — SODIUM CHLORIDE 0.9 % IV BOLUS (SEPSIS)
1000.0000 mL | Freq: Once | INTRAVENOUS | Status: AC
  Administered 2017-05-16: 1000 mL via INTRAVENOUS

## 2017-05-16 MED ORDER — ONDANSETRON HCL 4 MG/2ML IJ SOLN
4.0000 mg | Freq: Once | INTRAMUSCULAR | Status: AC
  Administered 2017-05-16: 4 mg via INTRAVENOUS
  Filled 2017-05-16: qty 2

## 2017-05-16 MED ORDER — IOPAMIDOL (ISOVUE-300) INJECTION 61%
100.0000 mL | Freq: Once | INTRAVENOUS | Status: AC | PRN
  Administered 2017-05-16: 100 mL via INTRAVENOUS

## 2017-05-16 MED ORDER — PIPERACILLIN-TAZOBACTAM 3.375 G IVPB
3.3750 g | Freq: Once | INTRAVENOUS | Status: AC
  Administered 2017-05-16: 3.375 g via INTRAVENOUS
  Filled 2017-05-16: qty 50

## 2017-05-16 MED ORDER — ONDANSETRON HCL 4 MG/2ML IJ SOLN
INTRAMUSCULAR | Status: AC
  Filled 2017-05-16: qty 2

## 2017-05-16 NOTE — ED Provider Notes (Signed)
[facility] Emergency Department Provider Note  ____________________________________________  Time seen: Approximately 9:30 AM  I have reviewed the triage vital signs and the nursing notes.   HISTORY  Chief Complaint Abdominal Pain   HPI Tamara Summers is a 66 y.o. female with h/o pancreatic cancer s/p chemo, radiation and Whipple who presents for evaluation of abdominal pain and distention. Patient reports chronic history of constipation however has not had a normal bowel movement in the last 4 days. She has been taking MiraLAX, Colace, Dulcolax, suppositories. She has been having small BMs. She has noted progressively worsening abdominal distention and pain. Currently endorsing 5 out of 10 pressure-like pain located diffuse across her abdomen and nonradiating. The pain is constant. She has had significant nausea and 2 episodes of nonbloody nonbilious emesis yesterday. Also had mild dysuria starting today. No fever or chills, no chest pain or shortness of breath. she is passing flatus. No prior history of SBO.  Past Medical History:  Diagnosis Date  . Allergic rhinitis   . Asthma   . Cancer ([place])   . Chickenpox   . Diabetes mellitus without complication ([place])   . Diverticulitis   . GERD (gastroesophageal reflux disease)   . Headache   . History of blood transfusion   . Hyperlipidemia   . Hypertension   . TIA (transient ischemic attack)   . Urinary retention     Patient Active Problem List   Diagnosis Date Noted  . Elevated LFTs 07/15/2016  . Stroke-like symptoms 04/18/2016  . Weakness 04/11/2016  . Mouth ulcer 03/14/2016  . Abdominal pain, left lower quadrant 03/11/2016  . Vision changes 01/29/2016  . Hyperlipidemia 01/01/2016  . Essential hypertension 11/29/2015  . Anemia 10/29/2015  . Shortness of breath 10/29/2015  . Increased heart rate 10/29/2015  . Diabetes mellitus type 2, controlled, without complications ([place]) 84/69/6295    Past  Surgical History:  Procedure Laterality Date  . ABDOMINAL HYSTERECTOMY  1987  . ABDOMINAL HYSTERECTOMY    . CHOLECYSTECTOMY  1988    Prior to Admission medications   Medication Sig Start Date End Date Taking? Authorizing Provider  albuterol (PROVENTIL HFA;VENTOLIN HFA) 108 (90 BASE) MCG/ACT inhaler Inhale 2 puffs into the lungs every 6 (six) hours as needed for wheezing or shortness of breath. 10/18/15   Schaevitz, Randall An, MD  aspirin EC 325 MG tablet Take 1 tablet (325 mg total) by mouth daily. Patient not taking: Reported on 03/12/2017 04/12/16   Dustin Flock, MD  atorvastatin (LIPITOR) 40 MG tablet Take 1 tablet (40 mg total) by mouth daily. 07/17/16   Leone Haven, MD  cyclobenzaprine (FLEXERIL) 5 MG tablet Take 1 tablet (5 mg total) by mouth 3 (three) times daily as needed for muscle spasms. 04/24/17   Harvest Dark, MD  dexlansoprazole (DEXILANT) 60 MG capsule Take 60 mg by mouth daily.    [provider]  dicyclomine (BENTYL) 20 MG tablet Take 1 tablet (20 mg total) by mouth every 6 (six) hours as needed. Patient taking differently: Take 20 mg by mouth daily.  07/12/16   Paulette Blanch, MD  escitalopram (LEXAPRO) 20 MG tablet Take 20 mg by mouth daily.     [provider]  glimepiride (AMARYL) 2 MG tablet Take 1 tablet (2 mg total) by mouth daily. 07/17/16   Leone Haven, MD  hydrochlorothiazide (HYDRODIURIL) 12.5 MG tablet Take 1 tablet (12.5 mg total) by mouth daily. 12/15/16 06/13/17  Leone Haven, MD  ibuprofen (ADVIL,MOTRIN)  200 MG tablet Take 400 mg by mouth every 6 (six) hours as needed for headache or mild pain.    [provider]  insulin lispro (HUMALOG) 100 UNIT/ML KiwkPen 2 units with meals + sliding scale as directed; E11.65 02/07/17   [provider]  Lancets Glory Rosebush ULTRASOFT) lancets Test once daily 12/15/16   Leone Haven, MD  ondansetron (ZOFRAN) 4 MG tablet Take 1 tablet (4 mg total) by mouth every 8 (eight)  hours as needed for nausea or vomiting. Patient not taking: Reported on 04/24/2017 07/15/16   Leone Haven, MD  Pancrelipase, Lip-Prot-Amyl, 24000-76000 units CPEP Take 2-3 capsules by mouth 3 (three) times daily. 2-3 caps tid with meals. Take 1 cap with snacks. 09/24/16   [provider]  prochlorperazine (COMPAZINE) 10 MG tablet Take 10 mg by mouth as needed. 04/02/17   [provider]  traMADol (ULTRAM) 50 MG tablet Take 1 tablet (50 mg total) by mouth every 6 (six) hours as needed. 04/24/17   Harvest Dark, MD    Allergies Adhesive [tape] and Sulfa antibiotics  Family History  Problem Relation Age of Onset  . Hyperlipidemia Unknown        Parent  . Heart disease Unknown        Parent  . Hypertension Unknown        Parent  . Stroke Unknown        Grandparent  . Mental illness Sister     Social History Social History  Substance Use Topics  . Smoking status: Former Research scientist (life sciences)  . Smokeless tobacco: Never Used  . Alcohol use 1.2 - 1.8 oz/week    2 - 3 Standard drinks or equivalent per week    Review of Systems  Constitutional: Negative for fever. Eyes: Negative for visual changes. ENT: Negative for sore throat. Neck: No neck pain  Cardiovascular: Negative for chest pain. Respiratory: Negative for shortness of breath. Gastrointestinal: + abdominal pain and distention, nausea, vomiting and constipation Genitourinary: + dysuria. Musculoskeletal: Negative for back pain. Skin: Negative for rash. Neurological: Negative for headaches, weakness or numbness. Psych: No SI or HI  ____________________________________________   PHYSICAL EXAM:  VITAL SIGNS: ED Triage Vitals  Enc Vitals Group     BP 05/16/17 0907 126/69     Pulse Rate 05/16/17 0907 (!) 129     Resp 05/16/17 0907 18     Temp 05/16/17 0907 98 F (36.7 C)     Temp Source 05/16/17 0907 Oral     SpO2 05/16/17 0907 96 %     Weight 05/16/17 0908 132 lb (59.9 kg)     Height 05/16/17 0908 5'  3" (1.6 m)     Head Circumference --      Peak Flow --      Pain Score 05/16/17 0907 5     Pain Loc --      Pain Edu? --      Excl. in [place]? --     Constitutional: Alert and oriented. Well appearing and in no apparent distress. HEENT:      Head: Normocephalic and atraumatic.         Eyes: Conjunctivae are normal. Sclera is non-icteric.       Mouth/Throat: Mucous membranes are moist.       Neck: Supple with no signs of meningismus. Cardiovascular: Regular rate and rhythm. No murmurs, gallops, or rubs. 2+ symmetrical distal pulses are present in all extremities. No JVD. Respiratory: Normal respiratory effort. Lungs are clear to  auscultation bilaterally. No wheezes, crackles, or rhonchi.  Gastrointestinal: Abdomen is soft, distended with hypoactive bowel sounds, mild diffuse tenderness. No rebound or guarding. Genitourinary: No CVA tenderness. Musculoskeletal: Nontender with normal range of motion in all extremities. No edema, cyanosis, or erythema of extremities. Neurologic: Normal speech and language. Face is symmetric. Moving all extremities. No gross focal neurologic deficits are appreciated. Skin: Skin is warm, dry and intact. No rash noted. Psychiatric: Mood and affect are normal. Speech and behavior are normal.  ____________________________________________   LABS (all labs ordered are listed, but only abnormal results are displayed)  Labs Reviewed  CBC WITH DIFFERENTIAL/PLATELET - Abnormal; Notable for the following:       Result Value   Hemoglobin 11.3 (*)    HCT 33.9 (*)    RDW 16.6 (*)    All other components within normal limits  COMPREHENSIVE METABOLIC PANEL - Abnormal; Notable for the following:    Potassium 3.0 (*)    Glucose, Bld 179 (*)    BUN 5 (*)    Calcium 8.8 (*)    Albumin 3.1 (*)    AST 202 (*)    ALT 181 (*)    Alkaline Phosphatase 369 (*)    Total Bilirubin 1.6 (*)    All other components within normal limits  BODY FLUID CELL COUNT WITH DIFFERENTIAL  - Abnormal; Notable for the following:    Appearance, Fluid HAZY (*)    All other components within normal limits  GRAM STAIN  CULTURE, BODY FLUID-BOTTLE  LIPASE, BLOOD  LACTIC ACID, PLASMA  CYTOLOGY - NON PAP   ____________________________________________  EKG  none  ____________________________________________  RADIOLOGY  CT a/p:  Marked circumferential wall thickening of the cecum and ascending colon most compatible with colitis. Considerations include infectious, ischemic or inflammatory etiologies.  Mild wall thickening of the rectum most compatible with proctocolitis.  Malpositioned enteric tube coiled in the stomach with the tip projecting at the GE junction.  Interval increase in size of enhancing lesion within the right hepatic lobe most compatible with hepatic metastatic disease.  Increasing moderate volume ascites.  Hepatic steatosis. ____________________________________________   PROCEDURES  Procedure(s) performed: yes .Paracentesis Date/Time: 05/17/2017 8:37 AM Performed by: Rudene Re Authorized by: Rudene Re   Consent:    Consent obtained:  Written   Consent given by:  Patient   Risks discussed:  Bleeding, bowel perforation, infection and pain Pre-procedure details:    Procedure purpose:  Diagnostic   Preparation: Patient was prepped and draped in usual sterile fashion   Anesthesia (see MAR for exact dosages):    Anesthesia method:  Local infiltration   Local anesthetic:  Lidocaine 1% w/o epi Procedure details:    Needle gauge:  20   Ultrasound guidance: yes     Puncture site:  R lower quadrant   Fluid removed amount:  15   Fluid appearance:  Yellow   Dressing:  Adhesive bandage Post-procedure details:    Patient tolerance of procedure:  Tolerated well, no immediate complications   Critical Care performed:  Yes  CRITICAL CARE Performed by: Rudene Re  ?  Total critical care time: 35 min  Critical care time  was exclusive of separately billable procedures and treating other patients.  Critical care was necessary to treat or prevent imminent or life-threatening deterioration.  Critical care was time spent personally by me on the following activities: development of treatment plan with patient and/or surrogate as well as nursing, discussions with consultants, evaluation of patient's response to treatment,  examination of patient, obtaining history from patient or surrogate, ordering and performing treatments and interventions, ordering and review of laboratory studies, ordering and review of radiographic studies, pulse oximetry and re-evaluation of patient's condition.  ____________________________________________   INITIAL IMPRESSION / ASSESSMENT AND PLAN / ED COURSE  66 y.o. female with h/o pancreatic cancer s/p chemo, radiation and Whipple who presents for evaluation of abdominal pain and distention. Patient looks very distended on exam with hypoactive bowel sounds concerning for small bowel obstruction. We'll send patient for CT scan. We'll treat her pain with morphine, fluids and Zofran. We'll check basic blood work including urine.  ED COURSE: CT concerning for ascites, worsening hepatic lesion, and colitis. Patient underwent diagnostic paracentesis and those studies are pending. She received Zosyn for possible infectious colitis and SBP. Labs showing worsening transaminitis with LFTs in the 200s, stable CBC and normal lactate. Tachycardia improved with IVF but persistent. I discussed patient with [facility] oncology who accepted her as a transfer for further management.   Clinical Course as of May 17 838  Sat May 16, 2017  1502 Patient just left the emergency department in stable condition in route to admission at [facility]. Her ascites studies do not show any evidence of SBP.  [CV]    Clinical Course User Index [CV] Rudene Re, MD    Pertinent labs & imaging results that were available during  my care of the patient were reviewed by me and considered in my medical decision making (see chart for details).    ____________________________________________   FINAL CLINICAL IMPRESSION(S) / ED DIAGNOSES  Final diagnoses:  Malignant ascites  Metastatic cancer ([place])  Tachycardia  Colitis      NEW MEDICATIONS STARTED DURING THIS VISIT:  Discharge Medication List as of 05/16/2017  3:20 PM       Note:  This document was prepared using Dragon voice recognition software and may include unintentional dictation errors.    Alfred Levins, [place], MD 05/17/17 [id number]

## 2017-05-16 NOTE — ED Notes (Signed)
Called for transport by [facility] 1416

## 2017-05-16 NOTE — ED Notes (Signed)
Accepted by [facility] at 1250, waiting for bed assignment

## 2017-05-16 NOTE — ED Notes (Signed)
Called [facility] center spoke to Brandt for transfer,  Called CT and had images powershared to [employer]

## 2017-05-16 NOTE — ED Notes (Signed)
Report called to kristen at [phone], will call again once ems is here to transport patient

## 2017-05-16 NOTE — ED Triage Notes (Signed)
Patient to ER for c/o abdominal bloating. States she has not had normal/good BM in 4 days. Patient states she has had some stool, but still has had to do suppository. Has G-J tube and h/o whipple procedure for pancreatic cancer. Starting chemo again next week d/t spot seen on liver recently.

## 2017-05-16 NOTE — ED Notes (Signed)
Husband is taking pts clothing home with him. Pt with [facility]. NAD at this time

## 2017-05-19 LAB — CYTOLOGY - NON PAP

## 2017-05-21 ENCOUNTER — Other Ambulatory Visit: Payer: Self-pay

## 2017-05-21 LAB — CULTURE, BODY FLUID W GRAM STAIN -BOTTLE: Culture: NO GROWTH

## 2017-05-21 LAB — CULTURE, BODY FLUID-BOTTLE

## 2017-05-21 MED ORDER — GLUCOSE BLOOD VI STRP: ORAL_STRIP | 2 refills | Status: AC

## 2017-05-31 ENCOUNTER — Emergency Department
Admission: EM | Admit: 2017-05-31 | Discharge: 2017-05-31 | Disposition: A | Discharge status: Home / Self Care | Payer: 59 | Attending: Emergency Medicine | Admitting: Emergency Medicine

## 2017-05-31 DIAGNOSIS — Z8673 Personal history of transient ischemic attack (TIA), and cerebral infarction without residual deficits: Secondary | ICD-10-CM | POA: Insufficient documentation

## 2017-05-31 DIAGNOSIS — E119 Type 2 diabetes mellitus without complications: Secondary | ICD-10-CM | POA: Diagnosis not present

## 2017-05-31 DIAGNOSIS — J45909 Unspecified asthma, uncomplicated: Secondary | ICD-10-CM | POA: Diagnosis not present

## 2017-05-31 DIAGNOSIS — Z8507 Personal history of malignant neoplasm of pancreas: Secondary | ICD-10-CM | POA: Diagnosis not present

## 2017-05-31 DIAGNOSIS — Z794 Long term (current) use of insulin: Secondary | ICD-10-CM | POA: Insufficient documentation

## 2017-05-31 DIAGNOSIS — K9409 Other complications of colostomy: Secondary | ICD-10-CM | POA: Insufficient documentation

## 2017-05-31 DIAGNOSIS — Z7982 Long term (current) use of aspirin: Secondary | ICD-10-CM | POA: Insufficient documentation

## 2017-05-31 DIAGNOSIS — I10 Essential (primary) hypertension: Secondary | ICD-10-CM | POA: Insufficient documentation

## 2017-05-31 DIAGNOSIS — T148XXA Other injury of unspecified body region, initial encounter: Secondary | ICD-10-CM

## 2017-05-31 DIAGNOSIS — R180 Malignant ascites: Secondary | ICD-10-CM | POA: Insufficient documentation

## 2017-05-31 DIAGNOSIS — Z87891 Personal history of nicotine dependence: Secondary | ICD-10-CM | POA: Insufficient documentation

## 2017-05-31 DIAGNOSIS — R109 Unspecified abdominal pain: Secondary | ICD-10-CM

## 2017-05-31 DIAGNOSIS — Z79899 Other long term (current) drug therapy: Secondary | ICD-10-CM | POA: Insufficient documentation

## 2017-05-31 LAB — CBC WITH DIFFERENTIAL/PLATELET
BASOS ABS: 0 10*3/uL (ref 0–0.1)
BASOS PCT: 1 %
EOS ABS: 0.1 10*3/uL (ref 0–0.7)
Eosinophils Relative: 2 %
HCT: 32.2 % — ABNORMAL LOW (ref 35.0–47.0)
Hemoglobin: 10.9 g/dL — ABNORMAL LOW (ref 12.0–16.0)
LYMPHS PCT: 18 %
Lymphs Abs: 1.1 10*3/uL (ref 1.0–3.6)
MCH: 28.7 pg (ref 26.0–34.0)
MCHC: 34 g/dL (ref 32.0–36.0)
MCV: 84.3 fL (ref 80.0–100.0)
MONO ABS: 0.4 10*3/uL (ref 0.2–0.9)
Monocytes Relative: 7 %
Neutro Abs: 4.6 10*3/uL (ref 1.4–6.5)
Neutrophils Relative %: 72 %
PLATELETS: 241 10*3/uL (ref 150–440)
RBC: 3.82 MIL/uL (ref 3.80–5.20)
RDW: 17 % — AB (ref 11.5–14.5)
WBC: 6.3 10*3/uL (ref 3.6–11.0)

## 2017-05-31 LAB — COMPREHENSIVE METABOLIC PANEL
ALT: 46 U/L (ref 14–54)
AST: 38 U/L (ref 15–41)
Albumin: 3.1 g/dL — ABNORMAL LOW (ref 3.5–5.0)
Alkaline Phosphatase: 225 U/L — ABNORMAL HIGH (ref 38–126)
Anion gap: 9 (ref 5–15)
BUN: 9 mg/dL (ref 6–20)
CHLORIDE: 97 mmol/L — AB (ref 101–111)
CO2: 28 mmol/L (ref 22–32)
Calcium: 8.8 mg/dL — ABNORMAL LOW (ref 8.9–10.3)
Creatinine, Ser: 0.58 mg/dL (ref 0.44–1.00)
Glucose, Bld: 369 mg/dL — ABNORMAL HIGH (ref 65–99)
Potassium: 3.8 mmol/L (ref 3.5–5.1)
Sodium: 134 mmol/L — ABNORMAL LOW (ref 135–145)
Total Bilirubin: 1 mg/dL (ref 0.3–1.2)
Total Protein: 6.7 g/dL (ref 6.5–8.1)

## 2017-05-31 LAB — LACTIC ACID, PLASMA
LACTIC ACID, VENOUS: 1.6 mmol/L (ref 0.5–1.9)
LACTIC ACID, VENOUS: 1.4 mmol/L (ref 0.5–1.9)

## 2017-05-31 LAB — TROPONIN I

## 2017-05-31 MED ORDER — MORPHINE SULFATE (PF) 4 MG/ML IV SOLN
4.0000 mg | Freq: Once | INTRAVENOUS | Status: DC
  Filled 2017-05-31: qty 1

## 2017-05-31 MED ORDER — MORPHINE SULFATE (PF) 4 MG/ML IV SOLN
4.0000 mg | Freq: Once | INTRAVENOUS | Status: AC
  Administered 2017-05-31: 4 mg via INTRAVENOUS

## 2017-05-31 NOTE — ED Notes (Signed)
Notified ann cales, rn first nurse regarding possible sepsis pt and chemo/recent indwelling tube placement history. Notified dr. Joni Fears, orders received for blood work.

## 2017-05-31 NOTE — ED Triage Notes (Signed)
Pt states she had her g/j tube replaced to luq on Wednesday. Pt states she now has pain around stoma site. Area is red and inflammed. Pt has pancreatic cancer and is on chemotherapy.

## 2017-05-31 NOTE — ED Provider Notes (Signed)
[facility] Emergency Department Provider Note  ____________________________________________   First MD Initiated Contact with Patient 05/31/17 2032     (approximate)  I have reviewed the triage vital signs and the nursing notes.   HISTORY  Chief Complaint Abdominal Pain and Wound Check   HPI Tamara Summers is a 66 y.o. female with a history of metastatic pancreatic cancer who is presenting to the emergency department complaining of irritation around her stoma from her [place] tube as well as drainage of the [place] stoma. She says that she has had ongoing drainage around the [place] stoma for months ever since March. She says that she had an incident where the tube came out of her intestine went to her stomach but she was projectile vomiting at that time. She is denying any vomiting at this time and says that she has been stooling and urinating normally. She says that she last paracentesis this past Thursday and since her belly has filled up and she feels bloating. She says that her pain feels like an 8 out of 10 at this time. She has had both the [place] tube and her paracentesis done through the interventional radiology Department at [facility]. She just had the G-tube replaced this past Thursday and says that the drainage has dramatically reduced. She now only has to change the dressing every 2-3 hours instead of every 20-30 minutes. Patient also says that she was out of her morphine and did not have any morphine for 24-48 hours but has taken 2 tabs and says that her pain is greatly reduced at this time. However, still complaining of the 8 out of 10 pain around her umbilicus.   Past Medical History:  Diagnosis Date  . Allergic rhinitis   . Asthma   . Cancer ([place])   . Chickenpox   . Diabetes mellitus without complication ([place])   . Diverticulitis   . GERD (gastroesophageal reflux disease)   . Headache   . History of blood transfusion   . Hyperlipidemia   . Hypertension     . TIA (transient ischemic attack)   . Urinary retention     Patient Active Problem List   Diagnosis Date Noted  . Elevated LFTs 07/15/2016  . Stroke-like symptoms 04/18/2016  . Weakness 04/11/2016  . Mouth ulcer 03/14/2016  . Abdominal pain, left lower quadrant 03/11/2016  . Vision changes 01/29/2016  . Hyperlipidemia 01/01/2016  . Essential hypertension 11/29/2015  . Anemia 10/29/2015  . Shortness of breath 10/29/2015  . Increased heart rate 10/29/2015  . Diabetes mellitus type 2, controlled, without complications ([place]) 17/40/8144    Past Surgical History:  Procedure Laterality Date  . ABDOMINAL HYSTERECTOMY  1987  . ABDOMINAL HYSTERECTOMY    . CHOLECYSTECTOMY  1988    Prior to Admission medications   Medication Sig Start Date End Date Taking? Authorizing Provider  albuterol (PROVENTIL HFA;VENTOLIN HFA) 108 (90 BASE) MCG/ACT inhaler Inhale 2 puffs into the lungs every 6 (six) hours as needed for wheezing or shortness of breath. 10/18/15   Maely Clements, Randall An, MD  aspirin EC 325 MG tablet Take 1 tablet (325 mg total) by mouth daily. Patient not taking: Reported on 03/12/2017 04/12/16   Dustin Flock, MD  atorvastatin (LIPITOR) 40 MG tablet TAKE 1 TABLET BY MOUTH  DAILY 05/18/17   Leone Haven, MD  cyclobenzaprine (FLEXERIL) 5 MG tablet Take 1 tablet (5 mg total) by mouth 3 (three) times daily as needed for muscle spasms. 04/24/17   Paduchowski,  Lennette Bihari, MD  dexlansoprazole (DEXILANT) 60 MG capsule Take 60 mg by mouth daily.    [provider]  dicyclomine (BENTYL) 20 MG tablet Take 1 tablet (20 mg total) by mouth every 6 (six) hours as needed. Patient taking differently: Take 20 mg by mouth daily.  07/12/16   Paulette Blanch, MD  escitalopram (LEXAPRO) 20 MG tablet Take 20 mg by mouth daily.     [provider]  glimepiride (AMARYL) 2 MG tablet Take 1 tablet (2 mg total) by mouth daily. 07/17/16   Leone Haven, MD  glucose blood (ONE TOUCH ULTRA TEST) test  strip Use as instructed 05/21/17   Leone Haven, MD  hydrochlorothiazide (HYDRODIURIL) 12.5 MG tablet Take 1 tablet (12.5 mg total) by mouth daily. 12/15/16 06/13/17  Leone Haven, MD  ibuprofen (ADVIL,MOTRIN) 200 MG tablet Take 400 mg by mouth every 6 (six) hours as needed for headache or mild pain.    [provider]  insulin lispro (HUMALOG) 100 UNIT/ML KiwkPen 2 units with meals + sliding scale as directed; E11.65 02/07/17   [provider]  Lancets Glory Rosebush ULTRASOFT) lancets Test once daily 12/15/16   Leone Haven, MD  ondansetron (ZOFRAN) 4 MG tablet Take 1 tablet (4 mg total) by mouth every 8 (eight) hours as needed for nausea or vomiting. Patient not taking: Reported on 04/24/2017 07/15/16   Leone Haven, MD  Pancrelipase, Lip-Prot-Amyl, 24000-76000 units CPEP Take 2-3 capsules by mouth 3 (three) times daily. 2-3 caps tid with meals. Take 1 cap with snacks. 09/24/16   [provider]  prochlorperazine (COMPAZINE) 10 MG tablet Take 10 mg by mouth as needed. 04/02/17   [provider]  traMADol (ULTRAM) 50 MG tablet Take 1 tablet (50 mg total) by mouth every 6 (six) hours as needed. 04/24/17   Harvest Dark, MD    Allergies Adhesive [tape] and Sulfa antibiotics  Family History  Problem Relation Age of Onset  . Hyperlipidemia Unknown        Parent  . Heart disease Unknown        Parent  . Hypertension Unknown        Parent  . Stroke Unknown        Grandparent  . Mental illness Sister     Social History Social History  Substance Use Topics  . Smoking status: Former Research scientist (life sciences)  . Smokeless tobacco: Never Used  . Alcohol use 1.2 - 1.8 oz/week    2 - 3 Standard drinks or equivalent per week    Review of Systems  Constitutional: No fever/chills Eyes: No visual changes. ENT: No sore throat. Cardiovascular: Denies chest pain. Respiratory: Denies shortness of breath. Gastrointestinal:  No nausea, no vomiting.  No diarrhea.  No  constipation. Genitourinary: Negative for dysuria. Musculoskeletal: Negative for back pain. Skin: Negative for rash. Neurological: Negative for headaches, focal weakness or numbness.   ____________________________________________   PHYSICAL EXAM:  VITAL SIGNS: ED Triage Vitals [05/31/17 1909]  Enc Vitals Group     BP 132/83     Pulse Rate (!) 102     Resp 16     Temp 98.5 F (36.9 C)     Temp Source Oral     SpO2 97 %     Weight 121 lb (54.9 kg)     Height 5\' 3"  (1.6 m)     Head Circumference      Peak Flow      Pain Score 8  Pain Loc      Pain Edu?      Excl. in [place]?     Constitutional: Alert and oriented. Well appearing and in no acute distress. Eyes: Conjunctivae are normal.  Head: Atraumatic. Nose: No congestion/rhinnorhea. Mouth/Throat: Mucous membranes are moist.  Neck: No stridor.   Cardiovascular: Tachycardic, regular rhythm. Grossly normal heart sounds.  Good peripheral circulation. Respiratory: Normal respiratory effort.  No retractions. Lungs CTAB. Gastrointestinal: Soft and nontender. Abdomen is distended but not tense. The [place] tube is in place and there is a small amount of tube feed draining at the stoma site. There is a small amount of erythema at the stoma site as well under the rubber stopper on the GJ tube. However, there is no surrounding tenderness, induration or warmth. There is no pus expressed. Musculoskeletal: No lower extremity tenderness nor edema.  No joint effusions. Neurologic:  Normal speech and language. No gross focal neurologic deficits are appreciated. Skin:  Skin is warm, dry and intact. No rash noted. Psychiatric: Mood and affect are normal. Speech and behavior are normal.  ____________________________________________   LABS (all labs ordered are listed, but only abnormal results are displayed)  Labs Reviewed  CBC WITH DIFFERENTIAL/PLATELET - Abnormal; Notable for the following:       Result Value   Hemoglobin 10.9 (*)    HCT  32.2 (*)    RDW 17.0 (*)    All other components within normal limits  COMPREHENSIVE METABOLIC PANEL - Abnormal; Notable for the following:    Sodium 134 (*)    Chloride 97 (*)    Glucose, Bld 369 (*)    Calcium 8.8 (*)    Albumin 3.1 (*)    Alkaline Phosphatase 225 (*)    All other components within normal limits  CULTURE, BLOOD (ROUTINE X 2)  CULTURE, BLOOD (ROUTINE X 2)  LACTIC ACID, PLASMA  TROPONIN I  LACTIC ACID, PLASMA   ____________________________________________  EKG  ED ECG REPORT I, Jamirra Curnow,  Youlanda Roys, the attending physician, personally viewed and interpreted this ECG.   Date: 05/31/2017  EKG Time: 1939  Rate: 111  Rhythm: sinus tachycardia  Axis: Normal  Intervals:none, criteria for LVH  ST&T Change: No ST segment elevation or depression. No abnormal T-wave inversion.  ____________________________________________  RADIOLOGY   ____________________________________________   PROCEDURES  Procedure(s) performed:   Procedures  Critical Care performed:   ____________________________________________   INITIAL IMPRESSION / ASSESSMENT AND PLAN / ED COURSE  Pertinent labs & imaging results that were available during my care of the patient were reviewed by me and considered in my medical decision making (see chart for details).  Patient's family says that her heart rate is usually at 114. It is 110 in the room. Normal white blood cell count as well as normal lactic acid. There appears to be irritation under the rubber stopper side of the GJ tube but without frank infection. Likely irritation from chronic moisture on the skin. We discussed using an antibacterial ointment to this area. Patient is also requesting IV morphine to "get ahead of her pain." She says that she has 60 tabs of morphine at home and will be using those for chronic pain control. We also discussed paracentesis but I recommended that due to the patient not being in any extremities at this  time that she call first thing in the morning to her oncologist episodes and outpatient through IR. Furthermore, she seems to be tolerating her tube feeds and has been improved since her  last GJ tube change. I do not see any evidence of an emergency medical condition requiring further workup or treatment at this time and feel that the patient would be best served by her chronic care team at [facility]. I discussed this with the family who are understanding and willing to comply. They say they have triple antibiotic as well to treat irritation around the stoma.      ____________________________________________   FINAL CLINICAL IMPRESSION(S) / ED DIAGNOSES  Abdominal pain. GJ tube. Abrasion.    NEW MEDICATIONS STARTED DURING THIS VISIT:  New Prescriptions   No medications on file     Note:  This document was prepared using Dragon voice recognition software and may include unintentional dictation errors.     Orbie Pyo, MD 05/31/17 2213

## 2017-05-31 NOTE — ED Notes (Signed)
Pt states that she had this g-j tube placed on Thursday, pt completed this round of chemo on Wednesday. Pt reports that she is having some drainage from the stoma site with redness around the tube, digestive drainage noted to the 4x4 gauze, tube is connected to her feeding treatment. Pt reports that she has some nausea, states that she has a decreased appetite and often feels that way related to the chemo treatments

## 2017-06-05 LAB — CULTURE, BLOOD (ROUTINE X 2)
CULTURE: NO GROWTH
Culture: NO GROWTH
Special Requests: ADEQUATE
Special Requests: ADEQUATE

## 2017-06-20 ENCOUNTER — Emergency Department
Admission: EM | Admit: 2017-06-20 | Discharge: 2017-06-20 | Disposition: A | Discharge status: Home / Self Care | Payer: 59 | Attending: Emergency Medicine | Admitting: Emergency Medicine

## 2017-06-20 ENCOUNTER — Encounter: Payer: Self-pay | Admitting: Emergency Medicine

## 2017-06-20 DIAGNOSIS — Z794 Long term (current) use of insulin: Secondary | ICD-10-CM | POA: Diagnosis not present

## 2017-06-20 DIAGNOSIS — J45909 Unspecified asthma, uncomplicated: Secondary | ICD-10-CM | POA: Insufficient documentation

## 2017-06-20 DIAGNOSIS — Z7982 Long term (current) use of aspirin: Secondary | ICD-10-CM | POA: Diagnosis not present

## 2017-06-20 DIAGNOSIS — E46 Unspecified protein-calorie malnutrition: Secondary | ICD-10-CM | POA: Insufficient documentation

## 2017-06-20 DIAGNOSIS — I10 Essential (primary) hypertension: Secondary | ICD-10-CM | POA: Diagnosis not present

## 2017-06-20 DIAGNOSIS — K9423 Gastrostomy malfunction: Secondary | ICD-10-CM | POA: Diagnosis present

## 2017-06-20 DIAGNOSIS — E119 Type 2 diabetes mellitus without complications: Secondary | ICD-10-CM | POA: Diagnosis not present

## 2017-06-20 DIAGNOSIS — Z87891 Personal history of nicotine dependence: Secondary | ICD-10-CM | POA: Diagnosis not present

## 2017-06-20 DIAGNOSIS — Z8507 Personal history of malignant neoplasm of pancreas: Secondary | ICD-10-CM | POA: Insufficient documentation

## 2017-06-20 DIAGNOSIS — Z79899 Other long term (current) drug therapy: Secondary | ICD-10-CM | POA: Insufficient documentation

## 2017-06-20 DIAGNOSIS — L258 Unspecified contact dermatitis due to other agents: Secondary | ICD-10-CM | POA: Insufficient documentation

## 2017-06-20 LAB — CBC WITH DIFFERENTIAL/PLATELET
Basophils Absolute: 0 10*3/uL (ref 0–0.1)
Basophils Relative: 1 %
EOS ABS: 0.1 10*3/uL (ref 0–0.7)
EOS PCT: 2 %
HCT: 33.9 % — ABNORMAL LOW (ref 35.0–47.0)
Hemoglobin: 11.1 g/dL — ABNORMAL LOW (ref 12.0–16.0)
LYMPHS ABS: 0.9 10*3/uL — AB (ref 1.0–3.6)
Lymphocytes Relative: 15 %
MCH: 27.1 pg (ref 26.0–34.0)
MCHC: 32.8 g/dL (ref 32.0–36.0)
MCV: 82.5 fL (ref 80.0–100.0)
MONOS PCT: 6 %
Monocytes Absolute: 0.4 10*3/uL (ref 0.2–0.9)
Neutro Abs: 4.6 10*3/uL (ref 1.4–6.5)
Neutrophils Relative %: 76 %
PLATELETS: 235 10*3/uL (ref 150–440)
RBC: 4.11 MIL/uL (ref 3.80–5.20)
RDW: 17.5 % — AB (ref 11.5–14.5)
WBC: 6.1 10*3/uL (ref 3.6–11.0)

## 2017-06-20 LAB — COMPREHENSIVE METABOLIC PANEL
ALK PHOS: 172 U/L — AB (ref 38–126)
ALT: 23 U/L (ref 14–54)
AST: 41 U/L (ref 15–41)
Albumin: 2.9 g/dL — ABNORMAL LOW (ref 3.5–5.0)
Anion gap: 10 (ref 5–15)
BUN: 7 mg/dL (ref 6–20)
CHLORIDE: 101 mmol/L (ref 101–111)
CO2: 28 mmol/L (ref 22–32)
CREATININE: 0.6 mg/dL (ref 0.44–1.00)
Calcium: 8.7 mg/dL — ABNORMAL LOW (ref 8.9–10.3)
GFR calc Af Amer: >60 mL/min (ref >60)
Glucose, Bld: 151 mg/dL — ABNORMAL HIGH (ref 65–99)
Potassium: 3.7 mmol/L (ref 3.5–5.1)
SODIUM: 139 mmol/L (ref 135–145)
Total Bilirubin: 1.5 mg/dL — ABNORMAL HIGH (ref 0.3–1.2)
Total Protein: 6.9 g/dL (ref 6.5–8.1)

## 2017-06-20 LAB — LACTIC ACID, PLASMA: LACTIC ACID, VENOUS: 1.2 mmol/L (ref 0.5–1.9)

## 2017-06-20 MED ORDER — SILVER SULFADIAZINE 1 % EX CREA
TOPICAL_CREAM | Freq: Once | CUTANEOUS | Status: AC
  Administered 2017-06-20: 18:00:00 via TOPICAL
  Filled 2017-06-20: qty 85

## 2017-06-20 MED ORDER — SILVER SULFADIAZINE 1 % EX CREA: TOPICAL_CREAM | CUTANEOUS | 1 refills | Status: DC

## 2017-06-20 NOTE — ED Provider Notes (Signed)
[facility] Emergency Department Provider Note  ____________________________________________   First MD Initiated Contact with Patient 06/20/17 1515     (approximate)  I have reviewed the triage vital signs and the nursing notes.   HISTORY  Chief Complaint No chief complaint on file.   HPI Tamara Summers is a 66 y.o. female who comes to the emergency department with several days of worsening moderate severity painful discomfort around her GJ tube site.She has a complex past medical history including pancreatic cancer and is status post Whipple.  She currently has a GJ tube which was formerly used to feed her stomach however is no longer used for feeding only for draining and is kept in place in case she needs to feeds in the future. Family has noticed green discoloration around the tube and breakdown around the skin site for the past several days which prompted the visit. She's had no fevers or chills. No chest pain or shortness of breath. The pain seems to be worse when there is discharge improved when there is a very around it.   Past Medical History:  Diagnosis Date  . Allergic rhinitis   . Asthma   . Cancer [facility])    Pancreatic Cancer  . Chickenpox   . Diabetes mellitus without complication ([place])   . Diverticulitis   . GERD (gastroesophageal reflux disease)   . Headache   . History of blood transfusion   . Hyperlipidemia   . Hypertension   . TIA (transient ischemic attack)   . Urinary retention     Patient Active Problem List   Diagnosis Date Noted  . Elevated LFTs 07/15/2016  . Stroke-like symptoms 04/18/2016  . Weakness 04/11/2016  . Mouth ulcer 03/14/2016  . Abdominal pain, left lower quadrant 03/11/2016  . Vision changes 01/29/2016  . Hyperlipidemia 01/01/2016  . Essential hypertension 11/29/2015  . Anemia 10/29/2015  . Shortness of breath 10/29/2015  . Increased heart rate 10/29/2015  . Diabetes mellitus type 2, controlled, without  complications ([place]) 09/60/4540    Past Surgical History:  Procedure Laterality Date  . ABDOMINAL HYSTERECTOMY  1987  . ABDOMINAL HYSTERECTOMY    . CHOLECYSTECTOMY  1988    Prior to Admission medications   Medication Sig Start Date End Date Taking? Authorizing Provider  albuterol (PROVENTIL HFA;VENTOLIN HFA) 108 (90 BASE) MCG/ACT inhaler Inhale 2 puffs into the lungs every 6 (six) hours as needed for wheezing or shortness of breath. 10/18/15   Schaevitz, Randall An, MD  aspirin EC 325 MG tablet Take 1 tablet (325 mg total) by mouth daily. Patient not taking: Reported on 03/12/2017 04/12/16   Dustin Flock, MD  atorvastatin (LIPITOR) 40 MG tablet TAKE 1 TABLET BY MOUTH  DAILY 05/18/17   Leone Haven, MD  cyclobenzaprine (FLEXERIL) 5 MG tablet Take 1 tablet (5 mg total) by mouth 3 (three) times daily as needed for muscle spasms. 04/24/17   Harvest Dark, MD  dexlansoprazole (DEXILANT) 60 MG capsule Take 60 mg by mouth daily.    [provider]  dicyclomine (BENTYL) 20 MG tablet Take 1 tablet (20 mg total) by mouth every 6 (six) hours as needed. Patient taking differently: Take 20 mg by mouth daily.  07/12/16   Paulette Blanch, MD  escitalopram (LEXAPRO) 20 MG tablet Take 20 mg by mouth daily.     [provider]  glimepiride (AMARYL) 2 MG tablet Take 1 tablet (2 mg total) by mouth daily. 07/17/16   Leone Haven, MD  glucose blood (ONE TOUCH ULTRA TEST) test strip Use as instructed 05/21/17   Leone Haven, MD  hydrochlorothiazide (HYDRODIURIL) 12.5 MG tablet Take 1 tablet (12.5 mg total) by mouth daily. 12/15/16 06/13/17  Leone Haven, MD  ibuprofen (ADVIL,MOTRIN) 200 MG tablet Take 400 mg by mouth every 6 (six) hours as needed for headache or mild pain.    [provider]  insulin lispro (HUMALOG) 100 UNIT/ML KiwkPen 2 units with meals + sliding scale as directed; E11.65 02/07/17   [provider]  Lancets Glory Rosebush ULTRASOFT) lancets Test once  daily 12/15/16   Leone Haven, MD  ondansetron (ZOFRAN) 4 MG tablet Take 1 tablet (4 mg total) by mouth every 8 (eight) hours as needed for nausea or vomiting. Patient not taking: Reported on 04/24/2017 07/15/16   Leone Haven, MD  Pancrelipase, Lip-Prot-Amyl, 24000-76000 units CPEP Take 2-3 capsules by mouth 3 (three) times daily. 2-3 caps tid with meals. Take 1 cap with snacks. 09/24/16   [provider]  prochlorperazine (COMPAZINE) 10 MG tablet Take 10 mg by mouth as needed. 04/02/17   [provider]  silver sulfADIAZINE (SILVADENE) 1 % cream Apply to affected area daily 06/20/17 06/20/18  Darel Hong, MD  traMADol (ULTRAM) 50 MG tablet Take 1 tablet (50 mg total) by mouth every 6 (six) hours as needed. 04/24/17   Harvest Dark, MD    Allergies Adhesive [tape] and Sulfa antibiotics  Family History  Problem Relation Age of Onset  . Hyperlipidemia Unknown        Parent  . Heart disease Unknown        Parent  . Hypertension Unknown        Parent  . Stroke Unknown        Grandparent  . Mental illness Sister     Social History Social History  Substance Use Topics  . Smoking status: Former Research scientist (life sciences)  . Smokeless tobacco: Never Used  . Alcohol use No    Review of Systems Constitutional: No fever/chills Eyes: No visual changes. ENT: No sore throat. Cardiovascular: Denies chest pain. Respiratory: Denies shortness of breath. Gastrointestinal: No abdominal pain.  No nausea, no vomiting.  No diarrhea.  No constipation. Genitourinary: Negative for dysuria. Musculoskeletal: Negative for back pain. Skin: ositivefor rash. Neurological: Negative for headaches, focal weakness or numbness.   ____________________________________________   PHYSICAL EXAM:  VITAL SIGNS: ED Triage Vitals  Enc Vitals Group     BP 06/20/17 1345 134/79     Pulse Rate 06/20/17 1345 (!) 102     Resp 06/20/17 1345 18     Temp 06/20/17 1345 97.9 F (36.6 C)     Temp Source  06/20/17 1345 Oral     SpO2 06/20/17 1345 96 %     Weight 06/20/17 1349 120 lb (54.4 kg)     Height 06/20/17 1349 5\' 3"  (1.6 m)     Head Circumference --      Peak Flow --      Pain Score 06/20/17 1349 8     Pain Loc --      Pain Edu? --      Excl. in [place]? --     Constitutional: alert and oriented 4 pleasant cooperative speaks in full clear sentences chronically ill-appearing Eyes: PERRL EOMI. Head: Atraumatic. Nose: No congestion/rhinnorhea. Mouth/Throat: No trismus Neck: No stridor.   Cardiovascular: Normal rate, regular rhythm. Grossly normal heart sounds.  Good peripheral circulation. Respiratory: Normal respiratory effort.  No retractions. Lungs CTAB  and moving good air Gastrointestinal: soft nontender GJ tube in place Musculoskeletal: No lower extremity edema   Neurologic:  Normal speech and language. No gross focal neurologic deficits are appreciated. Skin:  o purulent discharge from around the [place] e but there is what appears to be contact dermatitis for about 5 cm surrounding the tube Psychiatric: Mood and affect are normal. Speech and behavior are normal.    ____________________________________________   _____________________________________   LABS (all labs ordered are listed, but only abnormal results are displayed)  Labs Reviewed  COMPREHENSIVE METABOLIC PANEL - Abnormal; Notable for the following:       Result Value   Glucose, Bld 151 (*)    Calcium 8.7 (*)    Albumin 2.9 (*)    Alkaline Phosphatase 172 (*)    Total Bilirubin 1.5 (*)    All other components within normal limits  CBC WITH DIFFERENTIAL/PLATELET - Abnormal; Notable for the following:    Hemoglobin 11.1 (*)    HCT 33.9 (*)    RDW 17.5 (*)    Lymphs Abs 0.9 (*)    All other components within normal limits  CULTURE, BLOOD (SINGLE)  LACTIC ACID, PLASMA  LACTIC ACID, PLASMA  URINALYSIS, COMPLETE (UACMP) WITH MICROSCOPIC    Hypoalbuminemia is  concerning __________________________________________  EKG   ____________________________________________  RADIOLOGY   ____________________________________________   PROCEDURES  Procedure(s) performed: no  Procedures  Critical Care performed: no  Observation: no ____________________________________________   INITIAL IMPRESSION / ASSESSMENT AND PLAN / ED COURSE  Pertinent labs & imaging results that were available during my care of the patient were reviewed by me and considered in my medical decision making (see chart for details).  The patient has significant skin breakdown of what appears to be a contact dermatitis around her-GJ tube site. No evidence of active infection.her albumin is also low which is concerning for poor wound healing. I cleansed the wound and then applied a copious amount of Silvadene cream and then placed a barrier for colostomy surrounding the GJ tube site to provide a physical barrier against gastric secretions. At this point the patient is medically stable for outpatient management and I will have her follow-up in wound clinic. She is discharged home in improved condition.      ____________________________________________   FINAL CLINICAL IMPRESSION(S) / ED DIAGNOSES  Final diagnoses:  Contact dermatitis due to other agent, unspecified contact dermatitis type  Malnutrition, unspecified type ([place])      NEW MEDICATIONS STARTED DURING THIS VISIT:  New Prescriptions   SILVER SULFADIAZINE (SILVADENE) 1 % CREAM    Apply to affected area daily     Note:  This document was prepared using Dragon voice recognition software and may include unintentional dictation errors.     Darel Hong, MD 06/20/17 1743

## 2017-06-20 NOTE — ED Triage Notes (Signed)
Pt states was seen on 7/22 for redness to her GJ site. Pt presents via POV today for redness and green drainage from her [place] site to LUQ her abdomen. Pt's family reports that patient has hx of pancreatic cancer, and has the [place] tube to measure gastric content s/p whipple surgery. Pt presents with site noted to be red, with green drainage noted. Pt also c/o LUQ abdominal pain 8/10. Pt's daughter states that there is yellow/straw colored drainage from the [place] site that is from the ascites. Pt is alert and oriented at this time.

## 2017-06-20 NOTE — Discharge Instructions (Signed)
Please apply the Silvadene cream to your wound every day and keep up the barrier precautions.  Make an appointment to follow up with the Wound Care clinic this coming Monday for a reevaluation and return to the ED sooner for any concerns.  It was a pleasure to take care of you today, and thank you for coming to our emergency department.  If you have any questions or concerns before leaving please ask the nurse to grab me and I'm more than happy to go through your aftercare instructions again.  If you were prescribed any opioid pain medication today such as Norco, Vicodin, Percocet, morphine, hydrocodone, or oxycodone please make sure you do not drive when you are taking this medication as it can alter your ability to drive safely.  If you have any concerns once you are home that you are not improving or are in fact getting worse before you can make it to your follow-up appointment, please do not hesitate to call [phone] and come back for further evaluation.  Darel Hong, MD  Results for orders placed or performed during the hospital encounter of 06/20/17  Lactic acid, plasma  Result Value Ref Range   Lactic Acid, Venous 1.2 0.5 - 1.9 mmol/L  Comprehensive metabolic panel  Result Value Ref Range   Sodium 139 135 - 145 mmol/L   Potassium 3.7 3.5 - 5.1 mmol/L   Chloride 101 101 - 111 mmol/L   CO2 28 22 - 32 mmol/L   Glucose, Bld 151 (H) 65 - 99 mg/dL   BUN 7 6 - 20 mg/dL   Creatinine, Ser 0.60 0.44 - 1.00 mg/dL   Calcium 8.7 (L) 8.9 - 10.3 mg/dL   Total Protein 6.9 6.5 - 8.1 g/dL   Albumin 2.9 (L) 3.5 - 5.0 g/dL   AST 41 15 - 41 U/L   ALT 23 14 - 54 U/L   Alkaline Phosphatase 172 (H) 38 - 126 U/L   Total Bilirubin 1.5 (H) 0.3 - 1.2 mg/dL   GFR calc non Af Amer >60 >60 mL/min   GFR calc Af Amer >60 >60 mL/min   Anion gap 10 5 - 15  CBC with Differential  Result Value Ref Range   WBC 6.1 3.6 - 11.0 K/uL   RBC 4.11 3.80 - 5.20 MIL/uL   Hemoglobin 11.1 (L) 12.0 - 16.0 g/dL   HCT 33.9  (L) 35.0 - 47.0 %   MCV 82.5 80.0 - 100.0 fL   MCH 27.1 26.0 - 34.0 pg   MCHC 32.8 32.0 - 36.0 g/dL   RDW 17.5 (H) 11.5 - 14.5 %   Platelets 235 150 - 440 K/uL   Neutrophils Relative % 76 %   Neutro Abs 4.6 1.4 - 6.5 K/uL   Lymphocytes Relative 15 %   Lymphs Abs 0.9 (L) 1.0 - 3.6 K/uL   Monocytes Relative 6 %   Monocytes Absolute 0.4 0.2 - 0.9 K/uL   Eosinophils Relative 2 %   Eosinophils Absolute 0.1 0 - 0.7 K/uL   Basophils Relative 1 %   Basophils Absolute 0.0 0 - 0.1 K/uL

## 2017-06-20 NOTE — ED Notes (Signed)
Silvadene cream given to Dr. Mable Paris for application per MD.

## 2017-06-24 LAB — HM DIABETES EYE EXAM

## 2017-06-25 ENCOUNTER — Encounter: Payer: Self-pay | Admitting: Family Medicine

## 2017-06-25 LAB — CULTURE, BLOOD (SINGLE)
Culture: NO GROWTH
Special Requests: ADEQUATE

## 2017-06-29 ENCOUNTER — Ambulatory Visit: Payer: 59 | Admitting: Surgery

## 2017-09-04 ENCOUNTER — Encounter: Payer: Self-pay | Admitting: Emergency Medicine

## 2017-09-04 ENCOUNTER — Inpatient Hospital Stay
Admission: EM | Admit: 2017-09-04 | Discharge: 2017-09-06 | DRG: 372 | Disposition: A | Discharge status: Home Health | Payer: 59 | Attending: Internal Medicine | Admitting: Internal Medicine

## 2017-09-04 ENCOUNTER — Emergency Department: Payer: 59

## 2017-09-04 DIAGNOSIS — Z8673 Personal history of transient ischemic attack (TIA), and cerebral infarction without residual deficits: Secondary | ICD-10-CM | POA: Diagnosis not present

## 2017-09-04 DIAGNOSIS — Z8507 Personal history of malignant neoplasm of pancreas: Secondary | ICD-10-CM | POA: Diagnosis not present

## 2017-09-04 DIAGNOSIS — C787 Secondary malignant neoplasm of liver and intrahepatic bile duct: Secondary | ICD-10-CM | POA: Diagnosis present

## 2017-09-04 DIAGNOSIS — K219 Gastro-esophageal reflux disease without esophagitis: Secondary | ICD-10-CM | POA: Diagnosis present

## 2017-09-04 DIAGNOSIS — J45909 Unspecified asthma, uncomplicated: Secondary | ICD-10-CM | POA: Diagnosis present

## 2017-09-04 DIAGNOSIS — Z87891 Personal history of nicotine dependence: Secondary | ICD-10-CM | POA: Diagnosis not present

## 2017-09-04 DIAGNOSIS — K529 Noninfective gastroenteritis and colitis, unspecified: Secondary | ICD-10-CM | POA: Diagnosis present

## 2017-09-04 DIAGNOSIS — I10 Essential (primary) hypertension: Secondary | ICD-10-CM | POA: Diagnosis present

## 2017-09-04 DIAGNOSIS — Z7982 Long term (current) use of aspirin: Secondary | ICD-10-CM

## 2017-09-04 DIAGNOSIS — Z9049 Acquired absence of other specified parts of digestive tract: Secondary | ICD-10-CM | POA: Diagnosis not present

## 2017-09-04 DIAGNOSIS — E119 Type 2 diabetes mellitus without complications: Secondary | ICD-10-CM | POA: Diagnosis present

## 2017-09-04 DIAGNOSIS — I959 Hypotension, unspecified: Secondary | ICD-10-CM | POA: Diagnosis present

## 2017-09-04 DIAGNOSIS — F329 Major depressive disorder, single episode, unspecified: Secondary | ICD-10-CM | POA: Diagnosis present

## 2017-09-04 DIAGNOSIS — E860 Dehydration: Secondary | ICD-10-CM | POA: Diagnosis present

## 2017-09-04 DIAGNOSIS — E785 Hyperlipidemia, unspecified: Secondary | ICD-10-CM | POA: Diagnosis present

## 2017-09-04 DIAGNOSIS — Z66 Do not resuscitate: Secondary | ICD-10-CM | POA: Diagnosis present

## 2017-09-04 DIAGNOSIS — A0472 Enterocolitis due to Clostridium difficile, not specified as recurrent: Principal | ICD-10-CM | POA: Diagnosis present

## 2017-09-04 DIAGNOSIS — Z90411 Acquired partial absence of pancreas: Secondary | ICD-10-CM

## 2017-09-04 DIAGNOSIS — Z818 Family history of other mental and behavioral disorders: Secondary | ICD-10-CM

## 2017-09-04 DIAGNOSIS — Z90710 Acquired absence of both cervix and uterus: Secondary | ICD-10-CM

## 2017-09-04 DIAGNOSIS — R1011 Right upper quadrant pain: Secondary | ICD-10-CM | POA: Diagnosis not present

## 2017-09-04 DIAGNOSIS — Z794 Long term (current) use of insulin: Secondary | ICD-10-CM | POA: Diagnosis not present

## 2017-09-04 DIAGNOSIS — C250 Malignant neoplasm of head of pancreas: Secondary | ICD-10-CM

## 2017-09-04 LAB — CBC WITH DIFFERENTIAL/PLATELET
BASOS ABS: 0.1 10*3/uL (ref 0–0.1)
BASOS PCT: 1 %
EOS ABS: 0 10*3/uL (ref 0–0.7)
Eosinophils Relative: 0 %
HEMATOCRIT: 32 % — AB (ref 35.0–47.0)
HEMOGLOBIN: 10.1 g/dL — AB (ref 12.0–16.0)
Lymphocytes Relative: 9 %
Lymphs Abs: 0.8 10*3/uL — ABNORMAL LOW (ref 1.0–3.6)
MCH: 26.7 pg (ref 26.0–34.0)
MCHC: 31.6 g/dL — AB (ref 32.0–36.0)
MCV: 84.6 fL (ref 80.0–100.0)
Monocytes Absolute: 0.5 10*3/uL (ref 0.2–0.9)
Monocytes Relative: 6 %
NEUTROS ABS: 8 10*3/uL — AB (ref 1.4–6.5)
NEUTROS PCT: 84 %
Platelets: 276 10*3/uL (ref 150–440)
RBC: 3.78 MIL/uL — ABNORMAL LOW (ref 3.80–5.20)
RDW: 22.5 % — ABNORMAL HIGH (ref 11.5–14.5)
WBC: 9.5 10*3/uL (ref 3.6–11.0)

## 2017-09-04 LAB — URINALYSIS, COMPLETE (UACMP) WITH MICROSCOPIC
BACTERIA UA: NONE SEEN
BILIRUBIN URINE: NEGATIVE
GLUCOSE, UA: NEGATIVE mg/dL
HGB URINE DIPSTICK: NEGATIVE
KETONES UR: NEGATIVE mg/dL
Leukocytes, UA: NEGATIVE
NITRITE: NEGATIVE
PROTEIN: NEGATIVE mg/dL
RBC / HPF: NONE SEEN RBC/hpf (ref 0–5)
Specific Gravity, Urine: 1.025 (ref 1.005–1.030)
pH: 5 (ref 5.0–8.0)

## 2017-09-04 LAB — LACTIC ACID, PLASMA: Lactic Acid, Venous: 1.7 mmol/L (ref 0.5–1.9)

## 2017-09-04 LAB — GLUCOSE, CAPILLARY: GLUCOSE-CAPILLARY: 182 mg/dL — AB (ref 65–99)

## 2017-09-04 LAB — COMPREHENSIVE METABOLIC PANEL
ALBUMIN: 2.4 g/dL — AB (ref 3.5–5.0)
ALK PHOS: 641 U/L — AB (ref 38–126)
ALT: 31 U/L (ref 14–54)
ANION GAP: 14 (ref 5–15)
AST: 105 U/L — AB (ref 15–41)
BILIRUBIN TOTAL: 1.9 mg/dL — AB (ref 0.3–1.2)
BUN: 13 mg/dL (ref 6–20)
CALCIUM: 8.7 mg/dL — AB (ref 8.9–10.3)
CO2: 23 mmol/L (ref 22–32)
Chloride: 95 mmol/L — ABNORMAL LOW (ref 101–111)
Creatinine, Ser: 0.75 mg/dL (ref 0.44–1.00)
GFR calc Af Amer: >60 mL/min (ref >60)
GFR calc non Af Amer: >60 mL/min (ref >60)
Glucose, Bld: 187 mg/dL — ABNORMAL HIGH (ref 65–99)
POTASSIUM: 4.3 mmol/L (ref 3.5–5.1)
SODIUM: 132 mmol/L — AB (ref 135–145)
TOTAL PROTEIN: 7.2 g/dL (ref 6.5–8.1)

## 2017-09-04 LAB — TROPONIN I: Troponin I: 0.03 ng/mL (ref <0.03)

## 2017-09-04 LAB — LIPASE, BLOOD: Lipase: 11 U/L (ref 11–51)

## 2017-09-04 MED ORDER — ESCITALOPRAM OXALATE 20 MG PO TABS
20.0000 mg | ORAL_TABLET | Freq: Every day | ORAL | Status: DC
  Administered 2017-09-05 – 2017-09-06 (×2): 20 mg via ORAL
  Filled 2017-09-04 (×2): qty 1

## 2017-09-04 MED ORDER — IOPAMIDOL (ISOVUE-300) INJECTION 61%
75.0000 mL | Freq: Once | INTRAVENOUS | Status: AC | PRN
Start: 2017-09-04 — End: 2017-09-04
  Administered 2017-09-04: 75 mL via INTRAVENOUS

## 2017-09-04 MED ORDER — ALBUTEROL SULFATE (2.5 MG/3ML) 0.083% IN NEBU: 3.0000 mL | INHALATION_SOLUTION | Freq: Four times a day (QID) | RESPIRATORY_TRACT | Status: DC | PRN

## 2017-09-04 MED ORDER — ONDANSETRON HCL 4 MG/2ML IJ SOLN
4.0000 mg | Freq: Four times a day (QID) | INTRAMUSCULAR | Status: DC | PRN
  Administered 2017-09-06: 03:00:00 4 mg via INTRAVENOUS
  Filled 2017-09-04: qty 2

## 2017-09-04 MED ORDER — ACETAMINOPHEN 650 MG RE SUPP: 650.0000 mg | Freq: Four times a day (QID) | RECTAL | Status: DC | PRN

## 2017-09-04 MED ORDER — ONDANSETRON HCL 4 MG/2ML IJ SOLN: 4.0000 mg | Freq: Once | INTRAMUSCULAR | Status: DC

## 2017-09-04 MED ORDER — SODIUM CHLORIDE 0.9 % IV BOLUS (SEPSIS)
1000.0000 mL | Freq: Once | INTRAVENOUS | Status: AC
  Administered 2017-09-04: 1000 mL via INTRAVENOUS

## 2017-09-04 MED ORDER — SENNOSIDES-DOCUSATE SODIUM 8.6-50 MG PO TABS: 1.0000 | ORAL_TABLET | Freq: Every evening | ORAL | Status: DC | PRN

## 2017-09-04 MED ORDER — BISACODYL 5 MG PO TBEC: 5.0000 mg | DELAYED_RELEASE_TABLET | Freq: Every day | ORAL | Status: DC | PRN

## 2017-09-04 MED ORDER — PROCHLORPERAZINE MALEATE 10 MG PO TABS
10.0000 mg | ORAL_TABLET | ORAL | Status: DC | PRN
  Administered 2017-09-04: 10 mg via ORAL
  Filled 2017-09-04 (×2): qty 1

## 2017-09-04 MED ORDER — MIRTAZAPINE 15 MG PO TABS
7.5000 mg | ORAL_TABLET | Freq: Every day | ORAL | Status: DC
  Administered 2017-09-04 – 2017-09-05 (×2): 7.5 mg via ORAL
  Filled 2017-09-04 (×2): qty 1

## 2017-09-04 MED ORDER — HYDROCODONE-ACETAMINOPHEN 5-325 MG PO TABS: 1.0000 | ORAL_TABLET | ORAL | Status: DC | PRN

## 2017-09-04 MED ORDER — CIPROFLOXACIN IN D5W 400 MG/200ML IV SOLN
400.0000 mg | Freq: Once | INTRAVENOUS | Status: AC
  Administered 2017-09-04: 400 mg via INTRAVENOUS
  Filled 2017-09-04: qty 200

## 2017-09-04 MED ORDER — PIPERACILLIN-TAZOBACTAM 3.375 G IVPB
3.3750 g | Freq: Three times a day (TID) | INTRAVENOUS | Status: DC
  Administered 2017-09-04 – 2017-09-05 (×2): 3.375 g via INTRAVENOUS
  Filled 2017-09-04 (×2): qty 50

## 2017-09-04 MED ORDER — SODIUM CHLORIDE 0.9 % IV SOLN
INTRAVENOUS | Status: DC
  Administered 2017-09-04 – 2017-09-05 (×4): via INTRAVENOUS

## 2017-09-04 MED ORDER — ONDANSETRON HCL 4 MG PO TABS: 4.0000 mg | ORAL_TABLET | Freq: Four times a day (QID) | ORAL | Status: DC | PRN

## 2017-09-04 MED ORDER — ENOXAPARIN SODIUM 40 MG/0.4ML ~~LOC~~ SOLN
40.0000 mg | SUBCUTANEOUS | Status: DC
  Administered 2017-09-04: 21:00:00 40 mg via SUBCUTANEOUS
  Filled 2017-09-04: qty 0.4

## 2017-09-04 MED ORDER — MORPHINE SULFATE 15 MG PO TABS
15.0000 mg | ORAL_TABLET | Freq: Every day | ORAL | Status: DC
  Administered 2017-09-05 – 2017-09-06 (×2): 15 mg via ORAL
  Filled 2017-09-04 (×2): qty 1

## 2017-09-04 MED ORDER — IOPAMIDOL (ISOVUE-300) INJECTION 61%
15.0000 mL | INTRAVENOUS | Status: AC
  Administered 2017-09-04 (×2): 15 mL via ORAL

## 2017-09-04 MED ORDER — MORPHINE SULFATE ER 15 MG PO TBCR
15.0000 mg | EXTENDED_RELEASE_TABLET | Freq: Two times a day (BID) | ORAL | Status: DC | PRN
  Administered 2017-09-04 – 2017-09-06 (×2): 15 mg via ORAL
  Filled 2017-09-04 (×2): qty 1

## 2017-09-04 MED ORDER — ONDANSETRON HCL 4 MG/2ML IJ SOLN
INTRAMUSCULAR | Status: AC
  Filled 2017-09-04: qty 2

## 2017-09-04 MED ORDER — PANCRELIPASE (LIP-PROT-AMYL) 12000-38000 UNITS PO CPEP
24000.0000 [IU] | ORAL_CAPSULE | Freq: Three times a day (TID) | ORAL | Status: DC
Start: 2017-09-04 — End: 2017-09-06
  Administered 2017-09-05 – 2017-09-06 (×4): 24000 [IU] via ORAL
  Filled 2017-09-04 (×4): qty 2

## 2017-09-04 MED ORDER — ONDANSETRON HCL 4 MG/2ML IJ SOLN
4.0000 mg | Freq: Once | INTRAMUSCULAR | Status: AC
  Administered 2017-09-04: 4 mg via INTRAVENOUS

## 2017-09-04 MED ORDER — INSULIN ASPART 100 UNIT/ML ~~LOC~~ SOLN
0.0000 [IU] | Freq: Three times a day (TID) | SUBCUTANEOUS | Status: DC
  Administered 2017-09-05: 13:00:00 2 [IU] via SUBCUTANEOUS
  Administered 2017-09-05: 1 [IU] via SUBCUTANEOUS
  Administered 2017-09-05: 2 [IU] via SUBCUTANEOUS
  Administered 2017-09-06: 1 [IU] via SUBCUTANEOUS
  Filled 2017-09-04 (×4): qty 1

## 2017-09-04 MED ORDER — ACETAMINOPHEN 325 MG PO TABS: 650.0000 mg | ORAL_TABLET | Freq: Four times a day (QID) | ORAL | Status: DC | PRN

## 2017-09-04 MED ORDER — METRONIDAZOLE IN NACL 5-0.79 MG/ML-% IV SOLN
500.0000 mg | Freq: Once | INTRAVENOUS | Status: AC
  Administered 2017-09-04: 500 mg via INTRAVENOUS
  Filled 2017-09-04: qty 100

## 2017-09-04 MED ORDER — PANTOPRAZOLE SODIUM 40 MG PO TBEC
40.0000 mg | DELAYED_RELEASE_TABLET | Freq: Every day | ORAL | Status: DC
  Administered 2017-09-05 – 2017-09-06 (×2): 40 mg via ORAL
  Filled 2017-09-04 (×2): qty 1

## 2017-09-04 NOTE — ED Notes (Signed)
Pt reports that for the last 2-3 days she has had decreased intake and increased tiredness and weakness with right upper quad abd pain (pancreatitic cancer)

## 2017-09-04 NOTE — Progress Notes (Signed)
Pharmacy Antibiotic Note  Tamara Summers is a 66 y.o. female admitted on 09/04/2017 with diffuse colitis.  Pharmacy has been consulted for Zosyn dosing.  Plan: Start Zosyn 3.375 g IV q8h (EI)   Height: 5\' 3"  (160 cm) Weight: 96 lb 9.6 oz (43.8 kg) IBW/kg (Calculated) : 52.4  Temp (24hrs), Avg:97.6 F (36.4 C), Min:97.6 F (36.4 C), Max:97.6 F (36.4 C)   Recent Labs Lab 09/04/17 1134  WBC 9.5  CREATININE 0.75    Estimated Creatinine Clearance: 49.1 mL/min (by C-G formula based on SCr of 0.75 mg/dL).    Allergies  Allergen Reactions  . Adhesive [Tape]   . Sulfa Antibiotics Hives    Antimicrobials this admission: 10/26 Flagyl >> once  10/26 Zosyn >>    Microbiology results: 10/26 Stool>> sent  10/26 CDiff>>sent   Thank you for allowing pharmacy to be a part of this patient's care.  [place] Resident  09/04/2017 4:39 PM

## 2017-09-04 NOTE — ED Notes (Signed)
Pt in and out cath without difficulty - pt tolerated well - assisted by [facility] RN

## 2017-09-04 NOTE — ED Provider Notes (Signed)
[facility] Emergency Department Provider Note ____________________________________________   First MD Initiated Contact with Patient 09/04/17 1129     (approximate)  I have reviewed the triage vital signs and the nursing notes.   HISTORY  Chief Complaint Dehydration    HPI Tamara Summers is a 66 y.o. female with past medical history including pancreatic cancer and is status post Whipple (currently with a GJ tube which was formerly used for feeding, however no longer used for feeding only for draining and is kept in place in case she needs tube feeds in the future) and other PMH as below her sense of generalized weakness for the last several days, gradual onset, worsening, associated with decreased p.o. intake, and feeling like she is dehydrated.  Reports mild left upper quadrant abdominal pain around her GJ tube site, but otherwise no fever or other focal symptoms.   Past Medical History:  Diagnosis Date  . Allergic rhinitis   . Asthma   . Cancer [facility])    Pancreatic Cancer  . Chickenpox   . Diabetes mellitus without complication ([place])   . Diverticulitis   . GERD (gastroesophageal reflux disease)   . Headache   . History of blood transfusion   . Hyperlipidemia   . Hypertension   . TIA (transient ischemic attack)   . Urinary retention     Patient Active Problem List   Diagnosis Date Noted  . Elevated LFTs 07/15/2016  . Stroke-like symptoms 04/18/2016  . Weakness 04/11/2016  . Mouth ulcer 03/14/2016  . Abdominal pain, left lower quadrant 03/11/2016  . Vision changes 01/29/2016  . Hyperlipidemia 01/01/2016  . Essential hypertension 11/29/2015  . Anemia 10/29/2015  . Shortness of breath 10/29/2015  . Increased heart rate 10/29/2015  . Diabetes mellitus type 2, controlled, without complications ([place]) 05/09/1600    Past Surgical History:  Procedure Laterality Date  . ABDOMINAL HYSTERECTOMY  1987  . ABDOMINAL HYSTERECTOMY    .  CHOLECYSTECTOMY  1988    Prior to Admission medications   Medication Sig Start Date End Date Taking? Authorizing Provider  dexlansoprazole (DEXILANT) 60 MG capsule Take 60 mg by mouth daily.   Yes [provider]  dicyclomine (BENTYL) 20 MG tablet Take 1 tablet (20 mg total) by mouth every 6 (six) hours as needed. Patient taking differently: Take 20 mg by mouth daily.  07/12/16  Yes Paulette Blanch, MD  escitalopram (LEXAPRO) 20 MG tablet Take 20 mg by mouth daily.    Yes [provider]  glimepiride (AMARYL) 2 MG tablet Take 1 tablet (2 mg total) by mouth daily. 07/17/16  Yes Leone Haven, MD  insulin lispro (HUMALOG) 100 UNIT/ML KiwkPen 2 units with meals + sliding scale as directed; E11.65 02/07/17  Yes [provider]  mirtazapine (REMERON) 7.5 MG tablet Take 7.5 mg by mouth at bedtime.  08/31/17  Yes [provider]  morphine (MS CONTIN) 15 MG 12 hr tablet Take 15 mg by mouth every 12 (twelve) hours as needed.  08/20/17 09/19/17 Yes [provider]  morphine (MSIR) 15 MG tablet Take 15 mg by mouth daily.  08/19/17  Yes [provider]  Pancrelipase, Lip-Prot-Amyl, 24000-76000 units CPEP Take 1-2 capsules by mouth 3 (three) times daily. 2 caps tid with meals. Take 1 cap with snacks. 09/24/16  Yes [provider]  potassium chloride (K-DUR) 10 MEQ tablet Take 20 mEq by mouth daily. 08/20/17 08/20/18 Yes [provider]  albuterol (PROVENTIL HFA;VENTOLIN HFA) 108 (90 BASE)  MCG/ACT inhaler Inhale 2 puffs into the lungs every 6 (six) hours as needed for wheezing or shortness of breath. 10/18/15   Schaevitz, Randall An, MD  aspirin EC 325 MG tablet Take 1 tablet (325 mg total) by mouth daily. Patient not taking: Reported on 03/12/2017 04/12/16   Dustin Flock, MD  atorvastatin (LIPITOR) 40 MG tablet TAKE 1 TABLET BY MOUTH  DAILY Patient not taking: Reported on 09/04/2017 05/18/17   Leone Haven, MD  cyclobenzaprine  (FLEXERIL) 5 MG tablet Take 1 tablet (5 mg total) by mouth 3 (three) times daily as needed for muscle spasms. Patient not taking: Reported on 09/04/2017 04/24/17   Harvest Dark, MD  glucose blood (ONE TOUCH ULTRA TEST) test strip Use as instructed 05/21/17   Leone Haven, MD  hydrochlorothiazide (HYDRODIURIL) 12.5 MG tablet Take 1 tablet (12.5 mg total) by mouth daily. Patient not taking: Reported on 09/04/2017 12/15/16 06/13/17  Leone Haven, MD  Lancets Glory Rosebush ULTRASOFT) lancets Test once daily 12/15/16   Leone Haven, MD  ondansetron (ZOFRAN) 4 MG tablet Take 1 tablet (4 mg total) by mouth every 8 (eight) hours as needed for nausea or vomiting. Patient not taking: Reported on 04/24/2017 07/15/16   Leone Haven, MD  prochlorperazine (COMPAZINE) 10 MG tablet Take 10 mg by mouth as needed. 04/02/17   [provider]  silver sulfADIAZINE (SILVADENE) 1 % cream Apply to affected area daily 06/20/17 06/20/18  Darel Hong, MD  traMADol (ULTRAM) 50 MG tablet Take 1 tablet (50 mg total) by mouth every 6 (six) hours as needed. Patient not taking: Reported on 09/04/2017 04/24/17   Harvest Dark, MD    Allergies Adhesive [tape] and Sulfa antibiotics  Family History  Problem Relation Age of Onset  . Hyperlipidemia Unknown        Parent  . Heart disease Unknown        Parent  . Hypertension Unknown        Parent  . Stroke Unknown        Grandparent  . Mental illness Sister     Social History Social History  Substance Use Topics  . Smoking status: Former Research scientist (life sciences)  . Smokeless tobacco: Never Used  . Alcohol use No    Review of Systems  Constitutional: No fever; positive for chills. Eyes: No redness. ENT: No sore throat. Cardiovascular: Denies chest pain. Respiratory: Denies shortness of breath. Gastrointestinal: Positive for nausea with resolved vomiting.  No diarrhea. Genitourinary: Negative for dysuria.  Musculoskeletal: Negative for back  pain. Skin: Negative for rash. Neurological: Negative for headache.    ____________________________________________   PHYSICAL EXAM:  VITAL SIGNS: ED Triage Vitals  Enc Vitals Group     BP 09/04/17 1119 (!) 150/100     Pulse Rate 09/04/17 1119 (!) 122     Resp 09/04/17 1119 18     Temp 09/04/17 1119 97.6 F (36.4 C)     Temp Source 09/04/17 1119 Oral     SpO2 09/04/17 1119 99 %     Weight 09/04/17 1122 96 lb 9.6 oz (43.8 kg)     Height 09/04/17 1122 5' 3"  (1.6 m)     Head Circumference --      Peak Flow --      Pain Score 09/04/17 1119 9     Pain Loc --      Pain Edu? --      Excl. in [place]? --     Constitutional: Alert and oriented.  Weak  but relatively comfortable appearing. Eyes: Conjunctivae are normal.  EOMI.  PERRLA. Head: Atraumatic. Nose: No congestion/rhinnorhea. Mouth/Throat: Mucous membranes are slightly dry.   Neck: Normal range of motion.  Cardiovascular: Tachycardic, regular rhythm. Grossly normal heart sounds.  Good peripheral circulation. Respiratory: Normal respiratory effort.  No retractions. Lungs CTAB. Gastrointestinal: Soft with very mild LUQ tenderness. No distention. Skin around tube site is c/d/i with no induration, warmth, erythema or open wound.  No purulence.  Genitourinary: No CVA tenderness. Musculoskeletal: No lower extremity edema.  Extremities warm and well perfused.  Neurologic:  Normal speech and language. No gross focal neurologic deficits are appreciated.  Skin:  Skin is warm and dry. No rash noted. Psychiatric: Mood and affect are normal. Speech and behavior are normal.  ____________________________________________   LABS (all labs ordered are listed, but only abnormal results are displayed)  Labs Reviewed  CBC WITH DIFFERENTIAL/PLATELET - Abnormal; Notable for the following:       Result Value   RBC 3.78 (*)    Hemoglobin 10.1 (*)    HCT 32.0 (*)    MCHC 31.6 (*)    RDW 22.5 (*)    Neutro Abs 8.0 (*)    Lymphs Abs 0.8 (*)     All other components within normal limits  COMPREHENSIVE METABOLIC PANEL - Abnormal; Notable for the following:    Sodium 132 (*)    Chloride 95 (*)    Glucose, Bld 187 (*)    Calcium 8.7 (*)    Albumin 2.4 (*)    AST 105 (*)    Alkaline Phosphatase 641 (*)    Total Bilirubin 1.9 (*)    All other components within normal limits  TROPONIN I - Abnormal; Notable for the following:    Troponin I 0.03 (*)    All other components within normal limits  URINALYSIS, COMPLETE (UACMP) WITH MICROSCOPIC - Abnormal; Notable for the following:    Color, Urine YELLOW (*)    APPearance HAZY (*)    Squamous Epithelial / LPF 0-5 (*)    All other components within normal limits  C DIFFICILE QUICK SCREEN W PCR REFLEX  LIPASE, BLOOD  LACTIC ACID, PLASMA  LACTIC ACID, PLASMA   ____________________________________________  EKG  ED ECG REPORT I, Arta Silence, the attending physician, personally viewed and interpreted this ECG.  Date: 09/04/2017 EKG Time: 1126 Rate: 117 Rhythm: Sinus tachycardia QRS Axis: normal Intervals: normal; RSR' in V2 ST/T Wave abnormalities: normal Narrative Interpretation: no evidence of acute ischemia or other acute findings  ____________________________________________  RADIOLOGY  CXR: No focal infiltrate or other acute findings.  CT abd: pancolitis, as well as masses in liver consistent with metastatic disease  ____________________________________________   PROCEDURES  Procedure(s) performed: No    Critical Care performed: No ____________________________________________   INITIAL IMPRESSION / ASSESSMENT AND PLAN / ED COURSE  Pertinent labs & imaging results that were available during my care of the patient were reviewed by me and considered in my medical decision making (see chart for details).  66 year old female with past medical history as noted above presents with generalized weakness for several days, associated with decreased p.o.  intake and a feeling of dehydration.  Also reports mild left upper quadrant pain around her tube site.  Medical records reviewed in [place]; patient had visit in Aug 2018 for dermatitis around the [place] tube site.  Records are otherwise noncontributory except for cancer history as noted above in HPI.  On exam, patient is slightly weak but not acutely  ill appearing, vital signs are normal except for tachycardia, and the exam is otherwise without focal findings except for very mild left upper quadrant discomfort.  The differential includes dehydration, electrolyte abnormality or other metabolic cause, infection including UTI, pneumonia, or viral.  Lower suspicion for acute intra-abdominal process given the minimal exam findings, however if workup otherwise negative and patient has continued pain consider imaging.  Plan for labs, infection workup, fluids, and reassess.    ----------------------------------------- 2:23 PM on 09/04/2017 -----------------------------------------  Patient's alk phos elevated from baseline, and she had persistent upper abdominal pain and some drainage from tube site; will obtain CT.  ----------------------------------------- 4:05 PM on 09/04/2017 -----------------------------------------  CT reveals diffuse colitis; given presence of diarrhea, this most likely infectious.  Antibiotics initiated, and I added on a lactate to help rule out ischemic cause as well.  Patient and family informed of the findings, and will be admitted to the hospital.  Patient signed out to the hospitalist.  I spent a total of 10 minutes discussing results with patient and family members (husband, daughter).   ____________________________________________   FINAL CLINICAL IMPRESSION(S) / ED DIAGNOSES  Final diagnoses:  Colitis      NEW MEDICATIONS STARTED DURING THIS VISIT:  New Prescriptions   No medications on file     Note:  This document was prepared using Dragon voice recognition  software and may include unintentional dictation errors.    Arta Silence, MD 09/04/17 1606

## 2017-09-04 NOTE — ED Notes (Signed)
Pt continues to c/o nausea and is attempting to drink contrast

## 2017-09-04 NOTE — H&P (Signed)
[facility] [place] NAME: Tamara Summers    MR#:  [id number]  DATE OF BIRTH:  06-12-1951  DATE OF ADMISSION:  09/04/2017  PRIMARY CARE PHYSICIAN: Leone Haven, MD   REQUESTING/REFERRING PHYSICIAN: dr Cherylann Banas  CHIEF COMPLAINT:   Abdominal pain HISTORY OF PRESENT ILLNESS:  Tamara Summers  is a 66 y.o. female with a known history of metastatic pancreatic cancer and Whipple's procedure who presents with abdominal pain and generalized weakness. Patient reports over the past few days she has had generalized weakness with decreased by mouth intake and weight loss along with diffuse abdominal pain. She had one episode of diarrhea yesterday. She denies nausea and vomiting. She does not have an appetite. She denies fever or chills. CT scan ordered in the emergency room shows Diffuse, significant circumferential wall thickening involving the majority of the colon, concerning for a diffuse colitis which may be secondary to infection .  She has been started on ciprofloxacin and Flagyl.  GI panel and C. difficile panel have been ordered.  She has regular oncology follow-up at [facility]. She is no longer on chemotherapy. She does not have hospice or palliative care at home.  PAST MEDICAL HISTORY:   Past Medical History:  Diagnosis Date  . Allergic rhinitis   . Asthma   . Cancer [facility])    Pancreatic Cancer  . Chickenpox   . Diabetes mellitus without complication ([place])   . Diverticulitis   . GERD (gastroesophageal reflux disease)   . Headache   . History of blood transfusion   . Hyperlipidemia   . Hypertension   . TIA (transient ischemic attack)   . Urinary retention     PAST SURGICAL HISTORY:   Past Surgical History:  Procedure Laterality Date  . ABDOMINAL HYSTERECTOMY  1987  . ABDOMINAL HYSTERECTOMY    . CHOLECYSTECTOMY  1988    SOCIAL HISTORY:   Social History  Substance Use Topics  . Smoking status: Former Research scientist (life sciences)  . Smokeless  tobacco: Never Used  . Alcohol use No    FAMILY HISTORY:   Family History  Problem Relation Age of Onset  . Hyperlipidemia Unknown        Parent  . Heart disease Unknown        Parent  . Hypertension Unknown        Parent  . Stroke Unknown        Grandparent  . Mental illness Sister     DRUG ALLERGIES:   Allergies  Allergen Reactions  . Adhesive [Tape]   . Sulfa Antibiotics Hives    REVIEW OF SYSTEMS:   Review of Systems  Constitutional: Positive for malaise/fatigue and weight loss. Negative for chills and fever.  HENT: Negative.  Negative for ear discharge, ear pain, hearing loss, nosebleeds and sore throat.   Eyes: Negative.  Negative for blurred vision and pain.  Respiratory: Negative.  Negative for cough, hemoptysis, shortness of breath and wheezing.   Cardiovascular: Negative.  Negative for chest pain, palpitations and leg swelling.  Gastrointestinal: Positive for abdominal pain. Negative for blood in stool, diarrhea, nausea and vomiting.  Genitourinary: Negative.  Negative for dysuria.  Musculoskeletal: Negative.  Negative for back pain.  Skin: Negative.   Neurological: Positive for weakness. Negative for dizziness, tremors, speech change, focal weakness, seizures and headaches.  Endo/Heme/Allergies: Negative.  Does not bruise/bleed easily.  Psychiatric/Behavioral: Negative.  Negative for depression, hallucinations and suicidal ideas.    MEDICATIONS AT HOME:   Prior  to Admission medications   Medication Sig Start Date End Date Taking? Authorizing Provider  dexlansoprazole (DEXILANT) 60 MG capsule Take 60 mg by mouth daily.   Yes [provider]  dicyclomine (BENTYL) 20 MG tablet Take 1 tablet (20 mg total) by mouth every 6 (six) hours as needed. Patient taking differently: Take 20 mg by mouth daily.  07/12/16  Yes Paulette Blanch, MD  escitalopram (LEXAPRO) 20 MG tablet Take 20 mg by mouth daily.    Yes [provider]  glimepiride (AMARYL) 2 MG  tablet Take 1 tablet (2 mg total) by mouth daily. 07/17/16  Yes Leone Haven, MD  insulin lispro (HUMALOG) 100 UNIT/ML KiwkPen 2 units with meals + sliding scale as directed; E11.65 02/07/17  Yes [provider]  mirtazapine (REMERON) 7.5 MG tablet Take 7.5 mg by mouth at bedtime.  08/31/17  Yes [provider]  morphine (MS CONTIN) 15 MG 12 hr tablet Take 15 mg by mouth every 12 (twelve) hours as needed.  08/20/17 09/19/17 Yes [provider]  morphine (MSIR) 15 MG tablet Take 15 mg by mouth daily.  08/19/17  Yes [provider]  Pancrelipase, Lip-Prot-Amyl, 24000-76000 units CPEP Take 1-2 capsules by mouth 3 (three) times daily. 2 caps tid with meals. Take 1 cap with snacks. 09/24/16  Yes [provider]  potassium chloride (K-DUR) 10 MEQ tablet Take 20 mEq by mouth daily. 08/20/17 08/20/18 Yes [provider]  albuterol (PROVENTIL HFA;VENTOLIN HFA) 108 (90 BASE) MCG/ACT inhaler Inhale 2 puffs into the lungs every 6 (six) hours as needed for wheezing or shortness of breath. 10/18/15   Schaevitz, Randall An, MD  aspirin EC 325 MG tablet Take 1 tablet (325 mg total) by mouth daily. Patient not taking: Reported on 03/12/2017 04/12/16   Dustin Flock, MD  atorvastatin (LIPITOR) 40 MG tablet TAKE 1 TABLET BY MOUTH  DAILY Patient not taking: Reported on 09/04/2017 05/18/17   Leone Haven, MD  cyclobenzaprine (FLEXERIL) 5 MG tablet Take 1 tablet (5 mg total) by mouth 3 (three) times daily as needed for muscle spasms. Patient not taking: Reported on 09/04/2017 04/24/17   Harvest Dark, MD  glucose blood (ONE TOUCH ULTRA TEST) test strip Use as instructed 05/21/17   Leone Haven, MD  hydrochlorothiazide (HYDRODIURIL) 12.5 MG tablet Take 1 tablet (12.5 mg total) by mouth daily. Patient not taking: Reported on 09/04/2017 12/15/16 06/13/17  Leone Haven, MD  Lancets Glory Rosebush ULTRASOFT) lancets Test once daily 12/15/16   Leone Haven,  MD  ondansetron (ZOFRAN) 4 MG tablet Take 1 tablet (4 mg total) by mouth every 8 (eight) hours as needed for nausea or vomiting. Patient not taking: Reported on 04/24/2017 07/15/16   Leone Haven, MD  prochlorperazine (COMPAZINE) 10 MG tablet Take 10 mg by mouth as needed. 04/02/17   [provider]  silver sulfADIAZINE (SILVADENE) 1 % cream Apply to affected area daily 06/20/17 06/20/18  Darel Hong, MD  traMADol (ULTRAM) 50 MG tablet Take 1 tablet (50 mg total) by mouth every 6 (six) hours as needed. Patient not taking: Reported on 09/04/2017 04/24/17   Harvest Dark, MD      VITAL SIGNS:  Blood pressure (!) 150/100, pulse (!) 122, temperature 97.6 F (36.4 C), temperature source Oral, resp. rate 18, height 5\' 3"  (1.6 m), weight 43.8 kg (96 lb 9.6 oz), SpO2 97 %.  PHYSICAL EXAMINATION:   Physical Exam  Constitutional: She is oriented to person, place, and time  and well-developed, well-nourished, and in no distress. No distress.  Frail and thin  HENT:  Head: Normocephalic.  Eyes: No scleral icterus.  Neck: Normal range of motion. Neck supple. No JVD present. No tracheal deviation present.  Cardiovascular: Normal rate, regular rhythm and normal heart sounds.  Exam reveals no gallop and no friction rub.   No murmur heard. Pulmonary/Chest: Effort normal and breath sounds normal. No respiratory distress. She has no wheezes. She has no rales. She exhibits no tenderness.  Abdominal: Soft. Bowel sounds are normal. She exhibits no distension and no mass. There is tenderness. There is no rebound and no guarding.  Musculoskeletal: Normal range of motion. She exhibits no edema.  Neurological: She is alert and oriented to person, place, and time.  Skin: Skin is warm. No rash noted. No erythema.  Psychiatric: Affect and judgment normal.      LABORATORY PANEL:   CBC  Recent Labs Lab 09/04/17 1134  WBC 9.5  HGB 10.1*  HCT 32.0*  PLT 276    ------------------------------------------------------------------------------------------------------------------  Chemistries   Recent Labs Lab 09/04/17 1134  NA 132*  K 4.3  CL 95*  CO2 23  GLUCOSE 187*  BUN 13  CREATININE 0.75  CALCIUM 8.7*  AST 105*  ALT 31  ALKPHOS 641*  BILITOT 1.9*   ------------------------------------------------------------------------------------------------------------------  Cardiac Enzymes  Recent Labs Lab 09/04/17 1134  TROPONINI 0.03*   ------------------------------------------------------------------------------------------------------------------  RADIOLOGY:  Ct Abdomen Pelvis W Contrast  Result Date: 09/04/2017 CLINICAL DATA:  Abdominal pain with decreased intake EXAM: CT ABDOMEN AND PELVIS WITH CONTRAST TECHNIQUE: Multidetector CT imaging of the abdomen and pelvis was performed using the standard protocol following bolus administration of intravenous contrast. CONTRAST:  29mL ISOVUE-300 IOPAMIDOL (ISOVUE-300) INJECTION 61% COMPARISON:  05/16/2017, 04/24/2017, 03/12/2017 FINDINGS: Lower chest: Lung bases demonstrate no acute consolidation or pleural effusion. Normal heart size. Hepatobiliary: Heterogenous liver. Large infiltrative hypoenhancing mass within the right lobe of the liver, measuring approximately 8 cm cranial caudad by 7.2 cm transverse by 8.5 cm AP. Numerous other hypodense lesions are now visualized throughout the right greater than left lobes of the liver. Tiny pneumobilia in the left hepatic lobe likely postsurgical. Pancreas: Status post Whipple procedure. No significant inflammation about the remnant pancreatic tail. Spleen: Normal in size without focal abnormality. Adrenals/Urinary Tract: Adrenal glands are unremarkable. Kidneys are normal, without renal calculi, focal lesion, or hydronephrosis. Bladder is unremarkable. Stomach/Bowel: Gastrostomy tube appropriately positioned in the stomach. No dilated small bowel. Marked  circumferential wall thickening diffusely involving the colon. Appendix is thickened at 9 mm but this is felt secondary to the diffuse inflammatory process of the colon. Vascular/Lymphatic: Aortic atherosclerosis. Slight increased periaortic/retroperitoneal adenopathy since the prior CT. New enhancing nodule in the left upper quadrant measuring 11 mm. Multiple prominent lymph nodes re- demonstrated in the right upper quadrant, measuring up to 8 mm. Reproductive: Status post hysterectomy. No adnexal masses. Other: Peritoneal catheter tip is in the right abdomen. Negative for free air. Moderate volume of ascites in the abdomen and pelvis with minimal rim enhancement at the pelvis. Musculoskeletal: Interim development of multiple small sclerotic foci within the sacrum, iliac bones, and vertebra. IMPRESSION: 1. Status post Whipple procedure. Large hypoenhancing mass in the right lobe of the liver consistent with metastatic disease. Development of innumerable hypoenhancing masses throughout the right greater than left hepatic lobes concerning for progression of metastatic disease. 2. Diffuse, significant circumferential wall thickening involving the majority of the colon, concerning for a diffuse colitis which may be secondary to  infection (to include C difficile colitis), inflammatory bowel disease, less likely ischemic disease. Appendix is slightly enlarged and thickened but this is felt related to the diffuse inflammation of the colon. 3. Moderate volume of ascites with peritoneal dialysis catheter present. Slight rim enhancement of pelvic ascites fluid collection. 4. Development of a 11 mm hyperenhancing nodule in the left upper quadrant, similar in density to the spleen, possibly a splenule although metastatic focus not excluded. There is mild upper abdominal adenopathy and increased retroperitoneal adenopathy since the prior study 5. Development of multiple small sclerotic foci in the pelvis and spine concerning  for skeletal metastatic disease. Electronically Signed   By: Donavan Foil M.D.   On: 09/04/2017 15:24   Dg Chest Portable 1 View  Result Date: 09/04/2017 CLINICAL DATA:  Patient diagnosed with pancreatic carcinoma 1 year ago. Decreased p.o. intake over the past 2-3 days with right upper quadrant abdominal pain. EXAM: PORTABLE CHEST 1 VIEW COMPARISON:  PA and lateral chest 10/18/2015. FINDINGS: Right IJ Port-A-Cath is in place. Lungs are clear. Heart size is normal. No pneumothorax or pleural effusion. No bony abnormality. IMPRESSION: Negative chest. Electronically Signed   By: Inge Rise M.D.   On: 09/04/2017 12:15    EKG:  Sinus tachycardia heart rate 117 no ST elevation or depression  IMPRESSION AND PLAN:   66 year old female with metastatic pancreatic cancer who presents with abdominal pain and generalized weakness and found to have colitis on CT scan.  1. Diffuse colitis: Start Zosyn Changed to Cipro and Flagyl upon discharge Follow-up on GI panel and C. difficile panel. Clear liquid diet and advance as tolerated 2. Metastatic pancreatic cancer: Patient would benefit from hospice or palliative care follow-up She is advised to follow-up with her oncologist Continue pain management Continue pancreatic enzymes 3. Diabetes: Continue sliding scale with ADA diet  4. Depression: Continue Lexapro  5. GERD: Continue PPI    All the records are reviewed and case discussed with ED provider. Management plans discussed with the patient and family and they are in agreement  CODE STATUS: DNR  TOTAL TIME TAKING CARE OF THIS PATIENT: 43 minutes.    Jermani Eberlein M.D on 09/04/2017 at 4:17 PM  Between 7am to 6pm - Pager - [phone]  After 6pm go to [email or web address] - password [email or web address] [facility]  Office  [phone]  CC: Primary care physician; Leone Haven, MD

## 2017-09-04 NOTE — ED Notes (Signed)
Changed dressing around [place] - old dressing had large amount of green/yellow discharge with foul odor noted - provider aware

## 2017-09-04 NOTE — ED Triage Notes (Signed)
Pt states that she feels like she is dehydrated states that she is jittery and weak, states that she called the [facility] line who advised her to be seen

## 2017-09-05 LAB — GASTROINTESTINAL PANEL BY PCR, STOOL (REPLACES STOOL CULTURE)
Adenovirus F40/41: NOT DETECTED
Astrovirus: NOT DETECTED
CAMPYLOBACTER SPECIES: NOT DETECTED
CRYPTOSPORIDIUM: NOT DETECTED
CYCLOSPORA CAYETANENSIS: NOT DETECTED
ENTAMOEBA HISTOLYTICA: NOT DETECTED
ENTEROTOXIGENIC E COLI (ETEC): NOT DETECTED
Enteroaggregative E coli (EAEC): NOT DETECTED
Enteropathogenic E coli (EPEC): NOT DETECTED
Giardia lamblia: NOT DETECTED
Norovirus GI/GII: NOT DETECTED
PLESIMONAS SHIGELLOIDES: NOT DETECTED
Rotavirus A: NOT DETECTED
SALMONELLA SPECIES: NOT DETECTED
SAPOVIRUS (I, II, IV, AND V): NOT DETECTED
SHIGA LIKE TOXIN PRODUCING E COLI (STEC): NOT DETECTED
SHIGELLA/ENTEROINVASIVE E COLI (EIEC): NOT DETECTED
VIBRIO CHOLERAE: NOT DETECTED
VIBRIO SPECIES: NOT DETECTED
YERSINIA ENTEROCOLITICA: NOT DETECTED

## 2017-09-05 LAB — BASIC METABOLIC PANEL
ANION GAP: 6 (ref 5–15)
BUN: 8 mg/dL (ref 6–20)
CHLORIDE: 106 mmol/L (ref 101–111)
CO2: 23 mmol/L (ref 22–32)
Calcium: 7.7 mg/dL — ABNORMAL LOW (ref 8.9–10.3)
Creatinine, Ser: 0.59 mg/dL (ref 0.44–1.00)
GFR calc Af Amer: >60 mL/min (ref >60)
GLUCOSE: 109 mg/dL — AB (ref 65–99)
POTASSIUM: 3.8 mmol/L (ref 3.5–5.1)
Sodium: 135 mmol/L (ref 135–145)

## 2017-09-05 LAB — C DIFFICILE QUICK SCREEN W PCR REFLEX
C Diff antigen: POSITIVE — AB
C Diff toxin: NEGATIVE

## 2017-09-05 LAB — CLOSTRIDIUM DIFFICILE BY PCR: Toxigenic C. Difficile by PCR: POSITIVE — AB

## 2017-09-05 LAB — GLUCOSE, CAPILLARY
GLUCOSE-CAPILLARY: 190 mg/dL — AB (ref 65–99)
GLUCOSE-CAPILLARY: 162 mg/dL — AB (ref 65–99)
GLUCOSE-CAPILLARY: 168 mg/dL — AB (ref 65–99)
Glucose-Capillary: 126 mg/dL — ABNORMAL HIGH (ref 65–99)

## 2017-09-05 MED ORDER — VANCOMYCIN 50 MG/ML ORAL SOLUTION
125.0000 mg | Freq: Four times a day (QID) | ORAL | Status: DC
  Administered 2017-09-05 – 2017-09-06 (×4): 125 mg via ORAL
  Filled 2017-09-05 (×5): qty 2.5

## 2017-09-05 MED ORDER — ENOXAPARIN SODIUM 30 MG/0.3ML ~~LOC~~ SOLN
30.0000 mg | SUBCUTANEOUS | Status: DC
  Administered 2017-09-05: 30 mg via SUBCUTANEOUS
  Filled 2017-09-05: qty 0.3

## 2017-09-05 NOTE — Progress Notes (Signed)
Physical Therapy Evaluation Patient Details Name: Tamara Summers MRN: [id number] DOB: 1950-12-05 Today's Date: 09/05/2017   History of Present Illness  Patient is a 66 y.o. female admitted on 04 Sep 2017 with increasing weakness, decreased food intake, and weight loss. Patient has metastatic pancreatic cancer and recent whipple procedure. Admits to recent falls.  Clinical Impression  Patient is a pleasant female admitted for above listed reasons. On evaluation, patient demonstrates need for minimal assistance for bed mobility, primarily secondary to pain. Once standing, patient able to ambulate with improved dynamic balance, utilizing RW. It is believed that patient will continue to benefit from functional strength progressions to return to PLOF upon d/c. At this time, it is believed that she may require OOB supervision upon d/c home.    Follow Up Recommendations Home health PT;Supervision for mobility/OOB    Equipment Recommendations  Rolling walker with 5" wheels;3in1 (PT)    Recommendations for Other Services       Precautions / Restrictions Precautions Precautions: Fall Restrictions Weight Bearing Restrictions: No      Mobility  Bed Mobility Overal bed mobility: Needs Assistance Bed Mobility: Supine to Sit;Sit to Supine     Supine to sit: Min assist Sit to supine: Min assist   General bed mobility comments: Patient requires minimal assistance to move into sitting due to increased abdominal pain. Could bridge and scoot to [facility] with minimal assistance.  Transfers Overall transfer level: Modified independent Equipment used: Rolling walker (2 wheeled)             General transfer comment: Patient moves from sit to stand and stand to sit with good safety awareness.  Ambulation/Gait Ambulation/Gait assistance: Min guard Ambulation Distance (Feet): 20 Feet Assistive device: Rolling walker (2 wheeled)       General Gait Details: Patient ambulates at decreased cadence  within room with no LOB.  Stairs            Wheelchair Mobility    Modified Rankin (Stroke Patients Only)       Balance Overall balance assessment: History of Falls;Modified Independent                                           Pertinent Vitals/Pain Pain Assessment: 0-10 Pain Score: 8  Pain Location: Abdomen Pain Descriptors / Indicators: Aching Pain Intervention(s): Limited activity within patient's tolerance;Monitored during session;Repositioned    Home Living Family/patient expects to be discharged to:: Private residence Living Arrangements: Spouse/significant other Available Help at Discharge: Family;Available PRN/intermittently Type of Home: House Home Access: Stairs to enter   [employer] of Steps: 2 Home Layout: One level Home Equipment: None      Prior Function Level of Independence: Needs assistance   Gait / Transfers Assistance Needed: Household distances independently  ADL's / Homemaking Assistance Needed: Performed by husband        Journalist, newspaper        Extremity/Trunk Assessment   Upper Extremity Assessment Upper Extremity Assessment: Generalized weakness    Lower Extremity Assessment Lower Extremity Assessment: Generalized weakness       Communication   Communication: No difficulties  Cognition Arousal/Alertness: Awake/alert Behavior During Therapy: WFL for tasks assessed/performed Overall Cognitive Status: Within Functional Limits for tasks assessed  General Comments      Exercises     Assessment/Plan    PT Assessment Patient needs continued PT services  PT Problem List Decreased strength;Decreased activity tolerance;Decreased balance;Decreased mobility;Decreased knowledge of use of DME;Decreased safety awareness;Pain       PT Treatment Interventions DME instruction;Gait training;Stair training;Functional mobility training;Therapeutic  activities;Therapeutic exercise;Balance training;Patient/family education    PT Goals (Current goals can be found in the Care Plan section)  Acute Rehab PT Goals Patient Stated Goal: "To get stronger" PT Goal Formulation: With patient Time For Goal Achievement: 09/19/17 Potential to Achieve Goals: Good    Frequency Min 2X/week   Barriers to discharge        Co-evaluation               AM-PAC PT "6 Clicks" Daily Activity  Outcome Measure Difficulty turning over in bed (including adjusting bedclothes, sheets and blankets)?: Unable Difficulty moving from lying on back to sitting on the side of the bed? : Unable Difficulty sitting down on and standing up from a chair with arms (e.g., wheelchair, bedside commode, etc,.)?: Unable Help needed moving to and from a bed to chair (including a wheelchair)?: A Little Help needed walking in hospital room?: A Little Help needed climbing 3-5 steps with a railing? : A Little 6 Click Score: 12    End of Session Equipment Utilized During Treatment: Gait belt Activity Tolerance: Patient tolerated treatment well Patient left: in bed;with call bell/phone within reach;with bed alarm set Nurse Communication: Mobility status PT Visit Diagnosis: Unsteadiness on feet (R26.81);Muscle weakness (generalized) (M62.81);History of falling (Z91.81);Pain    Time: 9449-6759 PT Time Calculation (min) (ACUTE ONLY): 19 min   Charges:   PT Evaluation $PT Eval Low Complexity: 1 Low     PT G Codes:   PT G-Codes **NOT FOR INPATIENT CLASS** Functional Assessment Tool Used: AM-PAC 6 Clicks Basic Mobility;Clinical judgement Functional Limitation: Mobility: Walking and moving around Mobility: Walking and Moving Around Current Status (F6384): At least 60 percent but less than 80 percent impaired, limited or restricted Mobility: Walking and Moving Around Goal Status [phone]): At least 20 percent but less than 40 percent impaired, limited or restricted       Dorice Lamas, PT, DPT 09/05/2017, 4:02 PM

## 2017-09-05 NOTE — Progress Notes (Signed)
Enoxaparin Dose  Enoxparin 40 mg changed to 30 mg due to low body weight per protocol.  Larene Beach, PharmD

## 2017-09-05 NOTE — Progress Notes (Signed)
[facility] at [place] NAME: Tamara Summers    MR#:  [id number]  DATE OF BIRTH:  1951-05-05  SUBJECTIVE:   Patient had 3 bowel movements this morning however these were not diarrhea in nature and not loose.  REVIEW OF SYSTEMS:    Review of Systems  Constitutional: Negative for fever, chills weight loss She is feeling globally weak  hENT: Negative for ear pain, nosebleeds, congestion, facial swelling, rhinorrhea, neck pain, neck stiffness and ear discharge.   Respiratory: Negative for cough, shortness of breath, wheezing  Cardiovascular: Negative for chest pain, palpitations and leg swelling.  Gastrointestinal: Negative for heartburn, positive left lower quadrant abdominal pain, no vomiting, diarrhea or consitpation Genitourinary: Negative for dysuria, urgency, frequency, hematuria Musculoskeletal: Negative for back pain or joint pain Neurological: Negative for dizziness, seizures, syncope, focal weakness,  numbness and headaches.  Hematological: Does bruise easily.  Psychiatric/Behavioral: Negative for hallucinations, confusion, dysphoric mood    Tolerating Diet: yes      DRUG ALLERGIES:   Allergies  Allergen Reactions  . Adhesive [Tape]   . Sulfa Antibiotics Hives    VITALS:  Blood pressure (!) 91/56, pulse (!) 112, temperature 98.1 F (36.7 C), temperature source Oral, resp. rate (!) 24, height 5\' 3"  (1.6 m), weight 43.8 kg (96 lb 9.6 oz), SpO2 98 %.  PHYSICAL EXAMINATION:  Constitutional: Appears frail  hENT: Normocephalic. Marland Kitchen Oropharynx is clear and moist.  Eyes: Conjunctivae and EOM are normal. PERRLA, no scleral icterus.  Neck: Normal ROM. Neck supple. No JVD. No tracheal deviation. CVS: RRR, S1/S2 +, no murmurs, no gallops, no carotid bruit.  Pulmonary: Effort and breath sounds normal, no stridor, rhonchi, wheezes, rales.  Abdominal: Soft. BS +,  no distension, some mild left lower quadrant tenderness, no rebound or  guarding.  G tube placed Musculoskeletal: Normal range of motion. No edema and no tenderness.  Neuro: Alert. CN 2-12 grossly intact. No focal deficits. Skin: Skin is warm and dry. No rash noted. Psychiatric: Very flat affect   LABORATORY PANEL:   CBC  Recent Labs Lab 09/04/17 1134  WBC 9.5  HGB 10.1*  HCT 32.0*  PLT 276   ------------------------------------------------------------------------------------------------------------------  Chemistries   Recent Labs Lab 09/04/17 1134 09/05/17 0422  NA 132* 135  K 4.3 3.8  CL 95* 106  CO2 23 23  GLUCOSE 187* 109*  BUN 13 8  CREATININE 0.75 0.59  CALCIUM 8.7* 7.7*  AST 105*  --   ALT 31  --   ALKPHOS 641*  --   BILITOT 1.9*  --    ------------------------------------------------------------------------------------------------------------------  Cardiac Enzymes  Recent Labs Lab 09/04/17 1134  TROPONINI 0.03*   ------------------------------------------------------------------------------------------------------------------  RADIOLOGY:  Ct Abdomen Pelvis W Contrast  Result Date: 09/04/2017 CLINICAL DATA:  Abdominal pain with decreased intake EXAM: CT ABDOMEN AND PELVIS WITH CONTRAST TECHNIQUE: Multidetector CT imaging of the abdomen and pelvis was performed using the standard protocol following bolus administration of intravenous contrast. CONTRAST:  46mL ISOVUE-300 IOPAMIDOL (ISOVUE-300) INJECTION 61% COMPARISON:  05/16/2017, 04/24/2017, 03/12/2017 FINDINGS: Lower chest: Lung bases demonstrate no acute consolidation or pleural effusion. Normal heart size. Hepatobiliary: Heterogenous liver. Large infiltrative hypoenhancing mass within the right lobe of the liver, measuring approximately 8 cm cranial caudad by 7.2 cm transverse by 8.5 cm AP. Numerous other hypodense lesions are now visualized throughout the right greater than left lobes of the liver. Tiny pneumobilia in the left hepatic lobe likely postsurgical.  Pancreas: Status post Whipple procedure. No significant  inflammation about the remnant pancreatic tail. Spleen: Normal in size without focal abnormality. Adrenals/Urinary Tract: Adrenal glands are unremarkable. Kidneys are normal, without renal calculi, focal lesion, or hydronephrosis. Bladder is unremarkable. Stomach/Bowel: Gastrostomy tube appropriately positioned in the stomach. No dilated small bowel. Marked circumferential wall thickening diffusely involving the colon. Appendix is thickened at 9 mm but this is felt secondary to the diffuse inflammatory process of the colon. Vascular/Lymphatic: Aortic atherosclerosis. Slight increased periaortic/retroperitoneal adenopathy since the prior CT. New enhancing nodule in the left upper quadrant measuring 11 mm. Multiple prominent lymph nodes re- demonstrated in the right upper quadrant, measuring up to 8 mm. Reproductive: Status post hysterectomy. No adnexal masses. Other: Peritoneal catheter tip is in the right abdomen. Negative for free air. Moderate volume of ascites in the abdomen and pelvis with minimal rim enhancement at the pelvis. Musculoskeletal: Interim development of multiple small sclerotic foci within the sacrum, iliac bones, and vertebra. IMPRESSION: 1. Status post Whipple procedure. Large hypoenhancing mass in the right lobe of the liver consistent with metastatic disease. Development of innumerable hypoenhancing masses throughout the right greater than left hepatic lobes concerning for progression of metastatic disease. 2. Diffuse, significant circumferential wall thickening involving the majority of the colon, concerning for a diffuse colitis which may be secondary to infection (to include C difficile colitis), inflammatory bowel disease, less likely ischemic disease. Appendix is slightly enlarged and thickened but this is felt related to the diffuse inflammation of the colon. 3. Moderate volume of ascites with peritoneal dialysis catheter present.  Slight rim enhancement of pelvic ascites fluid collection. 4. Development of a 11 mm hyperenhancing nodule in the left upper quadrant, similar in density to the spleen, possibly a splenule although metastatic focus not excluded. There is mild upper abdominal adenopathy and increased retroperitoneal adenopathy since the prior study 5. Development of multiple small sclerotic foci in the pelvis and spine concerning for skeletal metastatic disease. Electronically Signed   By: Donavan Foil M.D.   On: 09/04/2017 15:24   Dg Chest Portable 1 View  Result Date: 09/04/2017 CLINICAL DATA:  Patient diagnosed with pancreatic carcinoma 1 year ago. Decreased p.o. intake over the past 2-3 days with right upper quadrant abdominal pain. EXAM: PORTABLE CHEST 1 VIEW COMPARISON:  PA and lateral chest 10/18/2015. FINDINGS: Right IJ Port-A-Cath is in place. Lungs are clear. Heart size is normal. No pneumothorax or pleural effusion. No bony abnormality. IMPRESSION: Negative chest. Electronically Signed   By: Inge Rise M.D.   On: 09/04/2017 12:15     ASSESSMENT AND PLAN:    66 year old female with metastatic pancreatic cancer who presents with abdominal pain and generalized weakness and found to have colitis on CT scan.  1. Diffuse colitis: Continue Zosyn Follow-up on GI panel and C. difficile panel.   2. Metastatic pancreatic cancer: Patient would benefit from hospice or palliative care follow-up after discharge. Continue pain management Continue pancreatic enzymes She does not use G tube for feeding  3. Diabetes: Continue sliding scale with ADA diet  4. Depression: Continue Lexapro  5. GERD: Continue PPI  6.  Hypotension: I will increase IV fluids and monitor blood pressure   Management plans discussed with the patient and she is in agreement.  CODE STATUS: Full  TOTAL TIME TAKING CARE OF THIS PATIENT: 30 minutes.   PT consultation for disposition planning.  POSSIBLE D/C tomorrow,  DEPENDING ON CLINICAL CONDITION.   Azya Barbero M.D on 09/05/2017 at 10:32 AM  Between 7am to 6pm - Pager -  [phone] After 6pm go to [email or web address] - password [email or web address] [facility]  Office  [phone]  CC: Primary care physician; Leone Haven, MD  Note: This dictation was prepared with Dragon dictation along with smaller phrase technology. Any transcriptional errors that result from this process are unintentional.

## 2017-09-06 LAB — GLUCOSE, CAPILLARY: GLUCOSE-CAPILLARY: 124 mg/dL — AB (ref 65–99)

## 2017-09-06 LAB — HIV ANTIBODY (ROUTINE TESTING W REFLEX): HIV Screen 4th Generation wRfx: NONREACTIVE

## 2017-09-06 MED ORDER — VANCOMYCIN HCL 125 MG PO CAPS: 125.0000 mg | ORAL_CAPSULE | Freq: Four times a day (QID) | ORAL | 0 refills | Status: AC

## 2017-09-06 MED ORDER — DICYCLOMINE HCL 20 MG PO TABS: 20.0000 mg | ORAL_TABLET | Freq: Every day | ORAL | Status: AC

## 2017-09-06 NOTE — Discharge Summary (Signed)
[facility] at [place] NAME: Tamara Summers    MR#:  [id number]  DATE OF BIRTH:  1951-03-31  DATE OF ADMISSION:  09/04/2017 ADMITTING PHYSICIAN: Bettey Costa, MD  DATE OF DISCHARGE: 09/06/2016  PRIMARY CARE PHYSICIAN: Leone Haven, MD    ADMISSION DIAGNOSIS:  Colitis [K52.9]  DISCHARGE DIAGNOSIS:  Active Problems:   Colitis   SECONDARY DIAGNOSIS:   Past Medical History:  Diagnosis Date  . Allergic rhinitis   . Asthma   . Cancer [facility])    Pancreatic Cancer  . Chickenpox   . Diabetes mellitus without complication ([place])   . Diverticulitis   . GERD (gastroesophageal reflux disease)   . Headache   . History of blood transfusion   . Hyperlipidemia   . Hypertension   . TIA (transient ischemic attack)   . Urinary retention     HOSPITAL COURSE:   66 year old female with metastatic pancreatic cancer who presents with abdominal pain and generalized weakness and found to have colitis on CT scan.  1. C diff with Diffuse colitis on CT scan: She will need 2 weeks of PO VANCOMYCIN. Her diarrhea has improved and she has no abdominal pain at discharge.   2. Metastatic pancreatic cancer: Patient would benefit from palliative care follow-up after discharge. Continue pain management Continue pancreatic enzymes She does not use G tube for feeding She will follow up with her ONCOLOGIST  3. Diabetes: Continue outpatient regimen with ADA diet  4. Depression: Continue Lexapro  5. GERD: Continue PPI  6.  Hypotension: This has resolved  DISCHARGE CONDITIONS AND DIET:   Stable Regular diet  CONSULTS OBTAINED:    DRUG ALLERGIES:   Allergies  Allergen Reactions  . Adhesive [Tape]   . Sulfa Antibiotics Hives    DISCHARGE MEDICATIONS:   Current Discharge Medication List    START taking these medications   Details  vancomycin (VANCOCIN) 125 MG capsule Take 1 capsule (125 mg total) by mouth 4 (four) times daily. Qty:  52 capsule, Refills: 0      CONTINUE these medications which have CHANGED   Details  dicyclomine (BENTYL) 20 MG tablet Take 1 tablet (20 mg total) by mouth daily.      CONTINUE these medications which have NOT CHANGED   Details  dexlansoprazole (DEXILANT) 60 MG capsule Take 60 mg by mouth daily.    escitalopram (LEXAPRO) 20 MG tablet Take 20 mg by mouth daily.     glimepiride (AMARYL) 2 MG tablet Take 1 tablet (2 mg total) by mouth daily. Qty: 30 tablet, Refills: 0    insulin lispro (HUMALOG) 100 UNIT/ML KiwkPen 2 units with meals + sliding scale as directed; E11.65    mirtazapine (REMERON) 7.5 MG tablet Take 7.5 mg by mouth at bedtime.     morphine (MS CONTIN) 15 MG 12 hr tablet Take 15 mg by mouth every 12 (twelve) hours as needed.     morphine (MSIR) 15 MG tablet Take 15 mg by mouth daily.     Pancrelipase, Lip-Prot-Amyl, 24000-76000 units CPEP Take 1-2 capsules by mouth 3 (three) times daily. 2 caps tid with meals. Take 1 cap with snacks.    albuterol (PROVENTIL HFA;VENTOLIN HFA) 108 (90 BASE) MCG/ACT inhaler Inhale 2 puffs into the lungs every 6 (six) hours as needed for wheezing or shortness of breath. Qty: 1 Inhaler, Refills: 0    glucose blood (ONE TOUCH ULTRA TEST) test strip Use as instructed Qty: 100 each, Refills: 2  Lancets (ONETOUCH ULTRASOFT) lancets Test once daily Qty: 100 each, Refills: 12    prochlorperazine (COMPAZINE) 10 MG tablet Take 10 mg by mouth as needed.      STOP taking these medications     potassium chloride (K-DUR) 10 MEQ tablet      aspirin EC 325 MG tablet      atorvastatin (LIPITOR) 40 MG tablet      cyclobenzaprine (FLEXERIL) 5 MG tablet      hydrochlorothiazide (HYDRODIURIL) 12.5 MG tablet      ondansetron (ZOFRAN) 4 MG tablet      silver sulfADIAZINE (SILVADENE) 1 % cream      traMADol (ULTRAM) 50 MG tablet           Today   CHIEF COMPLAINT:  I want to go home Diarrhea has improved no abdominal pain   VITAL  SIGNS:  Blood pressure 94/60, pulse (!) 118, temperature 98.2 F (36.8 C), temperature source Oral, resp. rate 16, height 5\' 3"  (1.6 m), weight 43.8 kg (96 lb 9.6 oz), SpO2 98 %.   REVIEW OF SYSTEMS:  Review of Systems  Constitutional: Positive for weight loss. Negative for chills, fever and malaise/fatigue.  HENT: Negative.  Negative for ear discharge, ear pain, hearing loss, nosebleeds and sore throat.   Eyes: Negative.  Negative for blurred vision and pain.  Respiratory: Negative.  Negative for cough, hemoptysis, shortness of breath and wheezing.   Cardiovascular: Negative.  Negative for chest pain, palpitations and leg swelling.  Gastrointestinal: Positive for diarrhea (improved). Negative for abdominal pain, blood in stool, nausea and vomiting.  Genitourinary: Negative.  Negative for dysuria.  Musculoskeletal: Negative.  Negative for back pain.  Skin: Negative.   Neurological: Positive for weakness. Negative for dizziness, tremors, speech change, focal weakness, seizures and headaches.  Endo/Heme/Allergies: Negative.  Does not bruise/bleed easily.  Psychiatric/Behavioral: Negative.  Negative for depression, hallucinations and suicidal ideas.     PHYSICAL EXAMINATION:  GENERAL:  66 y.o.-year-old patient lying in the bed with no acute distress.  Thin frail appearing NECK:  Supple, no jugular venous distention. No thyroid enlargement, no tenderness.  LUNGS: Normal breath sounds bilaterally, no wheezing, rales,rhonchi  No use of accessory muscles of respiration.  CARDIOVASCULAR: S1, S2 normal. No murmurs, rubs, or gallops.  ABDOMEN: Soft, non-tender, non-distended. Bowel sounds present. No organomegaly or mass.  EXTREMITIES: No pedal edema, cyanosis, or clubbing.  PSYCHIATRIC: The patient is alert and oriented x 3.  SKIN: No obvious rash, lesion, or ulcer.   DATA REVIEW:   CBC  Recent Labs Lab 09/04/17 1134  WBC 9.5  HGB 10.1*  HCT 32.0*  PLT 276    Chemistries    Recent Labs Lab 09/04/17 1134 09/05/17 0422  NA 132* 135  K 4.3 3.8  CL 95* 106  CO2 23 23  GLUCOSE 187* 109*  BUN 13 8  CREATININE 0.75 0.59  CALCIUM 8.7* 7.7*  AST 105*  --   ALT 31  --   ALKPHOS 641*  --   BILITOT 1.9*  --     Cardiac Enzymes  Recent Labs Lab 09/04/17 1134  TROPONINI 0.03*    Microbiology Results  @[email or web address] @  RADIOLOGY:  Ct Abdomen Pelvis W Contrast  Result Date: 09/04/2017 CLINICAL DATA:  Abdominal pain with decreased intake EXAM: CT ABDOMEN AND PELVIS WITH CONTRAST TECHNIQUE: Multidetector CT imaging of the abdomen and pelvis was performed using the standard protocol following bolus administration of intravenous contrast. CONTRAST:  7mL ISOVUE-300 IOPAMIDOL (ISOVUE-300) INJECTION 61% COMPARISON:  05/16/2017, 04/24/2017, 03/12/2017 FINDINGS: Lower chest: Lung bases demonstrate no acute consolidation or pleural effusion. Normal heart size. Hepatobiliary: Heterogenous liver. Large infiltrative hypoenhancing mass within the right lobe of the liver, measuring approximately 8 cm cranial caudad by 7.2 cm transverse by 8.5 cm AP. Numerous other hypodense lesions are now visualized throughout the right greater than left lobes of the liver. Tiny pneumobilia in the left hepatic lobe likely postsurgical. Pancreas: Status post Whipple procedure. No significant inflammation about the remnant pancreatic tail. Spleen: Normal in size without focal abnormality. Adrenals/Urinary Tract: Adrenal glands are unremarkable. Kidneys are normal, without renal calculi, focal lesion, or hydronephrosis. Bladder is unremarkable. Stomach/Bowel: Gastrostomy tube appropriately positioned in the stomach. No dilated small bowel. Marked circumferential wall thickening diffusely involving the colon. Appendix is thickened at 9 mm but this is felt secondary to the diffuse inflammatory process of the colon. Vascular/Lymphatic: Aortic atherosclerosis. Slight increased periaortic/retroperitoneal  adenopathy since the prior CT. New enhancing nodule in the left upper quadrant measuring 11 mm. Multiple prominent lymph nodes re- demonstrated in the right upper quadrant, measuring up to 8 mm. Reproductive: Status post hysterectomy. No adnexal masses. Other: Peritoneal catheter tip is in the right abdomen. Negative for free air. Moderate volume of ascites in the abdomen and pelvis with minimal rim enhancement at the pelvis. Musculoskeletal: Interim development of multiple small sclerotic foci within the sacrum, iliac bones, and vertebra. IMPRESSION: 1. Status post Whipple procedure. Large hypoenhancing mass in the right lobe of the liver consistent with metastatic disease. Development of innumerable hypoenhancing masses throughout the right greater than left hepatic lobes concerning for progression of metastatic disease. 2. Diffuse, significant circumferential wall thickening involving the majority of the colon, concerning for a diffuse colitis which may be secondary to infection (to include C difficile colitis), inflammatory bowel disease, less likely ischemic disease. Appendix is slightly enlarged and thickened but this is felt related to the diffuse inflammation of the colon. 3. Moderate volume of ascites with peritoneal dialysis catheter present. Slight rim enhancement of pelvic ascites fluid collection. 4. Development of a 11 mm hyperenhancing nodule in the left upper quadrant, similar in density to the spleen, possibly a splenule although metastatic focus not excluded. There is mild upper abdominal adenopathy and increased retroperitoneal adenopathy since the prior study 5. Development of multiple small sclerotic foci in the pelvis and spine concerning for skeletal metastatic disease. Electronically Signed   By: Donavan Foil M.D.   On: 09/04/2017 15:24   Dg Chest Portable 1 View  Result Date: 09/04/2017 CLINICAL DATA:  Patient diagnosed with pancreatic carcinoma 1 year ago. Decreased p.o. intake over  the past 2-3 days with right upper quadrant abdominal pain. EXAM: PORTABLE CHEST 1 VIEW COMPARISON:  PA and lateral chest 10/18/2015. FINDINGS: Right IJ Port-A-Cath is in place. Lungs are clear. Heart size is normal. No pneumothorax or pleural effusion. No bony abnormality. IMPRESSION: Negative chest. Electronically Signed   By: Inge Rise M.D.   On: 09/04/2017 12:15      Current Discharge Medication List    START taking these medications   Details  vancomycin (VANCOCIN) 125 MG capsule Take 1 capsule (125 mg total) by mouth 4 (four) times daily. Qty: 52 capsule, Refills: 0      CONTINUE these medications which have CHANGED   Details  dicyclomine (BENTYL) 20 MG tablet Take 1 tablet (20 mg total) by mouth daily.      CONTINUE these medications which have NOT CHANGED   Details  dexlansoprazole (DEXILANT) 60  MG capsule Take 60 mg by mouth daily.    escitalopram (LEXAPRO) 20 MG tablet Take 20 mg by mouth daily.     glimepiride (AMARYL) 2 MG tablet Take 1 tablet (2 mg total) by mouth daily. Qty: 30 tablet, Refills: 0    insulin lispro (HUMALOG) 100 UNIT/ML KiwkPen 2 units with meals + sliding scale as directed; E11.65    mirtazapine (REMERON) 7.5 MG tablet Take 7.5 mg by mouth at bedtime.     morphine (MS CONTIN) 15 MG 12 hr tablet Take 15 mg by mouth every 12 (twelve) hours as needed.     morphine (MSIR) 15 MG tablet Take 15 mg by mouth daily.     Pancrelipase, Lip-Prot-Amyl, 24000-76000 units CPEP Take 1-2 capsules by mouth 3 (three) times daily. 2 caps tid with meals. Take 1 cap with snacks.    albuterol (PROVENTIL HFA;VENTOLIN HFA) 108 (90 BASE) MCG/ACT inhaler Inhale 2 puffs into the lungs every 6 (six) hours as needed for wheezing or shortness of breath. Qty: 1 Inhaler, Refills: 0    glucose blood (ONE TOUCH ULTRA TEST) test strip Use as instructed Qty: 100 each, Refills: 2    Lancets (ONETOUCH ULTRASOFT) lancets Test once daily Qty: 100 each, Refills: 12     prochlorperazine (COMPAZINE) 10 MG tablet Take 10 mg by mouth as needed.      STOP taking these medications     potassium chloride (K-DUR) 10 MEQ tablet      aspirin EC 325 MG tablet      atorvastatin (LIPITOR) 40 MG tablet      cyclobenzaprine (FLEXERIL) 5 MG tablet      hydrochlorothiazide (HYDRODIURIL) 12.5 MG tablet      ondansetron (ZOFRAN) 4 MG tablet      silver sulfADIAZINE (SILVADENE) 1 % cream      traMADol (ULTRAM) 50 MG tablet            Management plans discussed with the patient and she is in agreement. Stable for discharge home w [facility]  Patient should follow up with pcp  CODE STATUS:     Code Status Orders        Start     Ordered   09/04/17 1633  Full code  Continuous     09/04/17 1632    Code Status History    Date Active Date Inactive Code Status Order ID Comments User Context   09/04/2017  4:29 PM 09/04/2017  4:32 PM DNR [id number]  Bettey Costa, MD ED   04/11/2016  7:02 PM 04/12/2016  5:38 PM Full Code [id number]  Vaughan Basta, MD Inpatient      TOTAL TIME TAKING CARE OF THIS PATIENT: 38 minutes.    Note: This dictation was prepared with Dragon dictation along with smaller phrase technology. Any transcriptional errors that result from this process are unintentional.  Guy Toney M.D on 09/06/2017 at 10:34 AM  Between 7am to 6pm - Pager - [phone] After 6pm go to [email or web address] - password [email or web address]  Office  [phone]  CC: Primary care physician; Leone Haven, MD

## 2017-09-06 NOTE — Progress Notes (Signed)
Pt  Discharge home with vanc prescription. No c/o of pain. Skin warm and dry iv removed. Left with husband.

## 2017-09-06 NOTE — Care Management Note (Addendum)
Case Management Note  Patient Details  Name: Tamara Summers MRN: [id number] Date of Birth: 07/30/1951  Subjective/Objective:  Tamara Summers reports that she has a HH=RN coming to her home 2x weekly but does not know whether this nurse is connected with her insurance, with the [facility], or with a home health agency. A referral was called to Melene Muller at [facility] for HH= PT, Aide.  Calls to other local home health providers did not find any other local home health providers.                 Action/Plan:   Expected Discharge Date:  09/06/17               Expected Discharge Plan:  [place]  In-House Referral:  NA  Discharge planning Services  CM Consult  Post Acute Care Choice:  Home Health Choice offered to:  Patient  DME Arranged:  N/A DME Agency:  NA  HH Arranged:  RN, PT, Nurse's Aide [place] Agency:  [place]  Status of Service:  Completed, signed off  If discussed at [place] of Stay Meetings, dates discussed:    Additional Comments:  Navika Hoopes A, RN 09/06/2017, 2:14 PM

## 2017-10-10 DEATH — deceased

## 2018-10-31 IMAGING — CT CT RENAL STONE PROTOCOL
2 of 4 series · 15 of 46 positions shown, 17 images · non-contrast
Comparison: CT abdomen dated 03/12/2017. Abdominal MRI dated
07/16/2016.

CLINICAL DATA: Pt reports left sided flank pain that began about 2
days ago. Denies fever, denies dysuria. Has had kidney stone in
past. Hx of pancreatic CA.

EXAM:
CT ABDOMEN AND PELVIS WITHOUT CONTRAST
TECHNIQUE: Multidetector CT imaging of the abdomen and pelvis was performed
following the standard protocol without IV contrast.

[Series 2: stone full standard · axial · 0.67mm/px · z∈[-447,-32]mm · 12 of 95 slices shown, 14 images]
[im 8/95  soft-tissue]
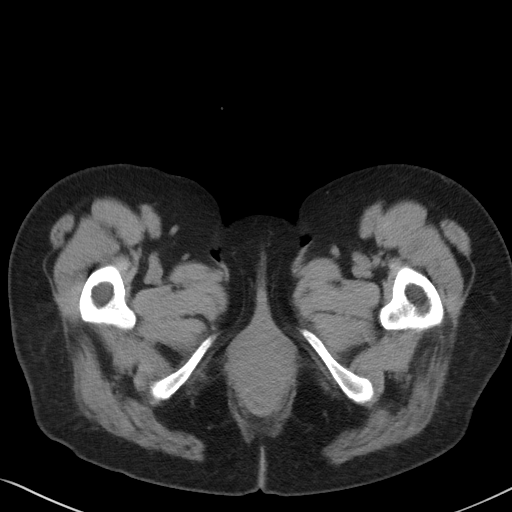
[im 8/95  bone]
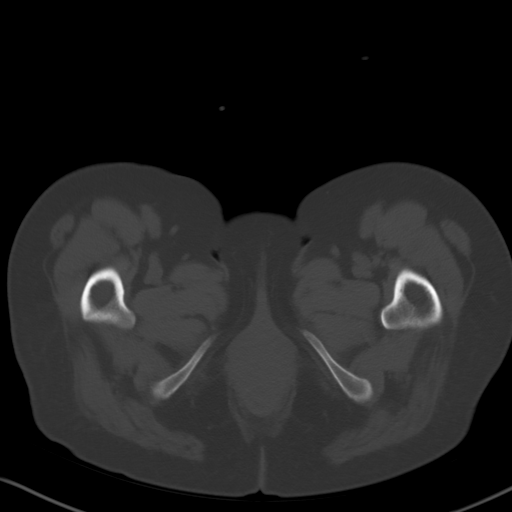
[im 16/95  soft-tissue]
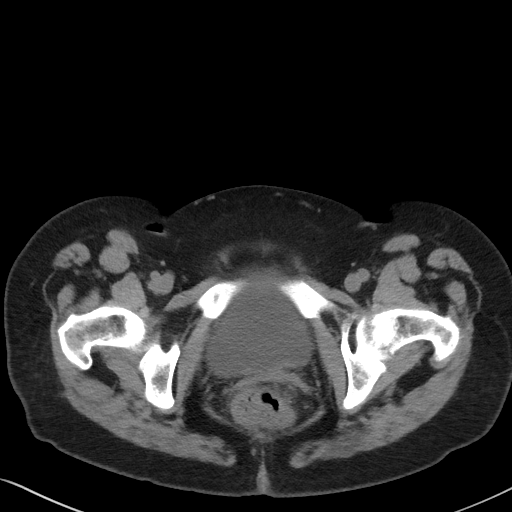
[im 23/95  soft-tissue]
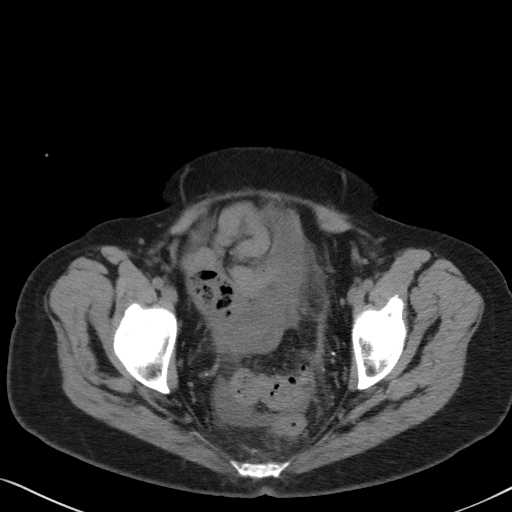
[im 31/95  soft-tissue]
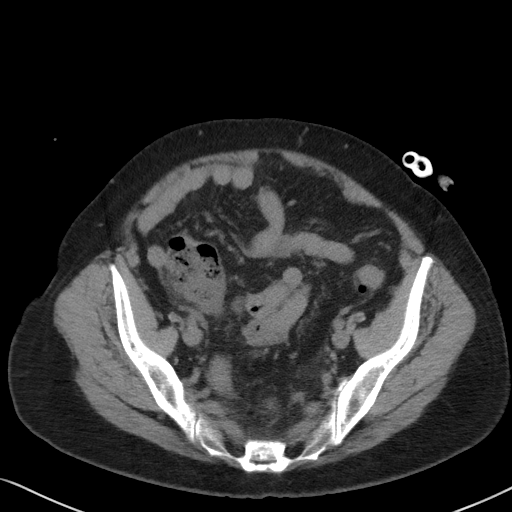
[im 38/95  soft-tissue]
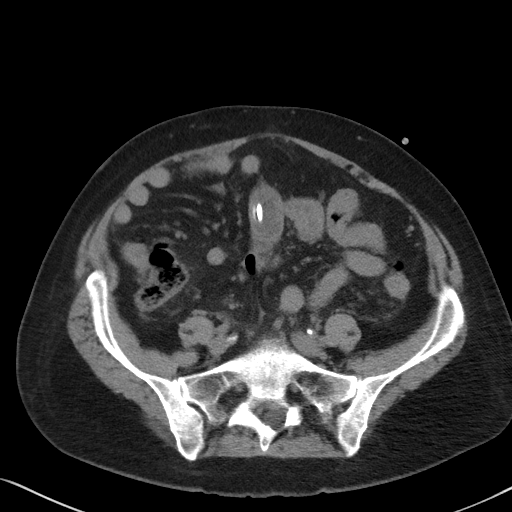
[im 46/95  soft-tissue]
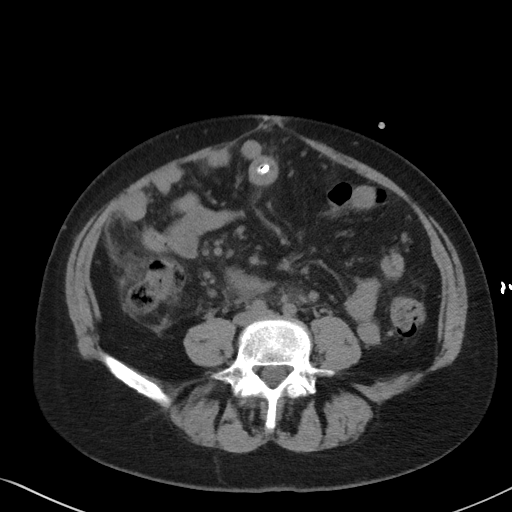
[im 53/95  soft-tissue]
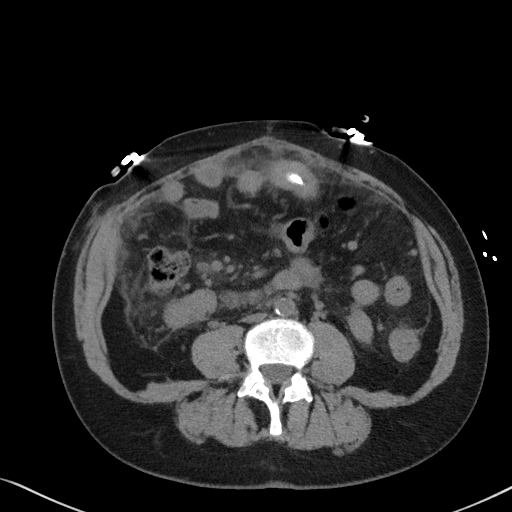
[im 61/95  soft-tissue]
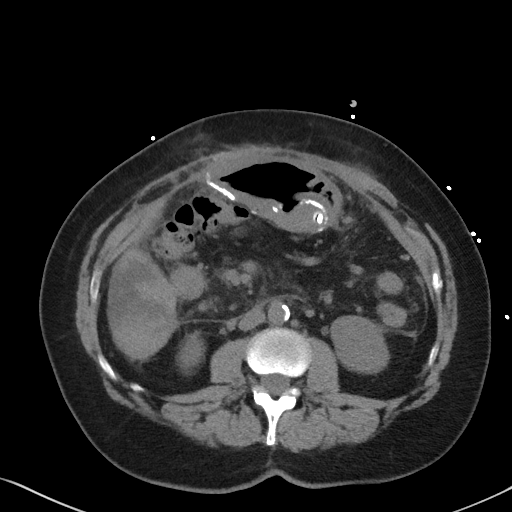
[im 68/95  soft-tissue]
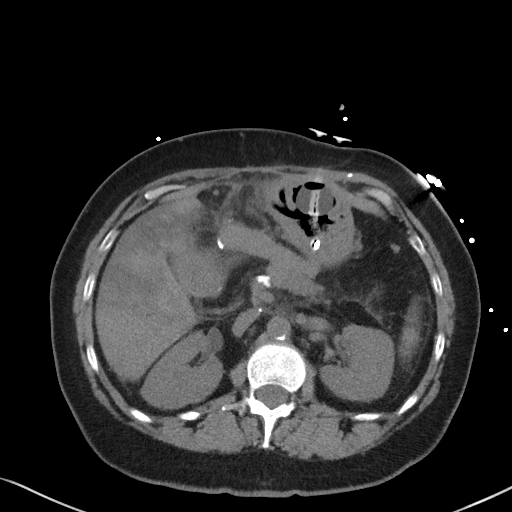
[im 68/95  bone]
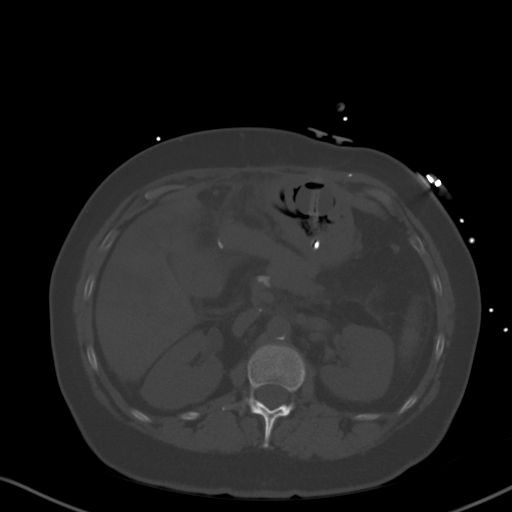
[im 76/95  soft-tissue]
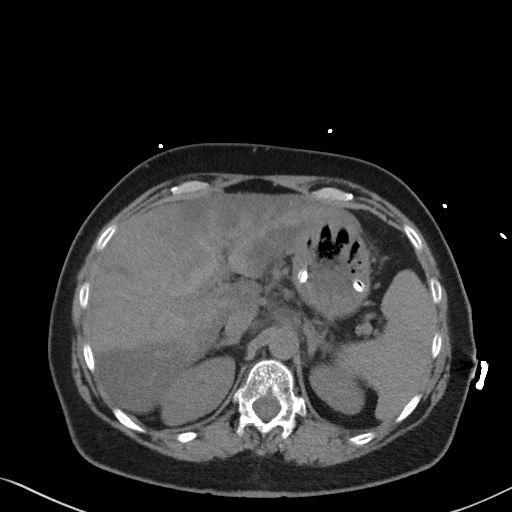
[im 83/95  soft-tissue]
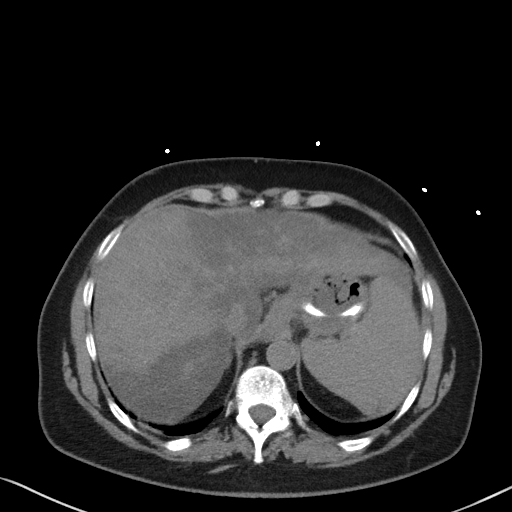
[im 91/95  soft-tissue]
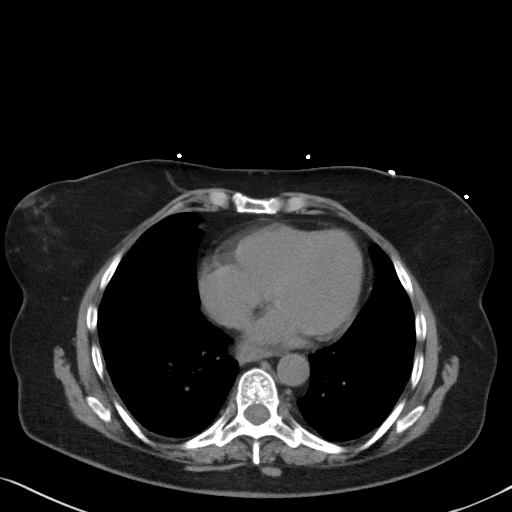

[Series 5: coronal · coronal · 0.67mm/px · 3 of 127 slices shown]
[im 43/127  soft-tissue]
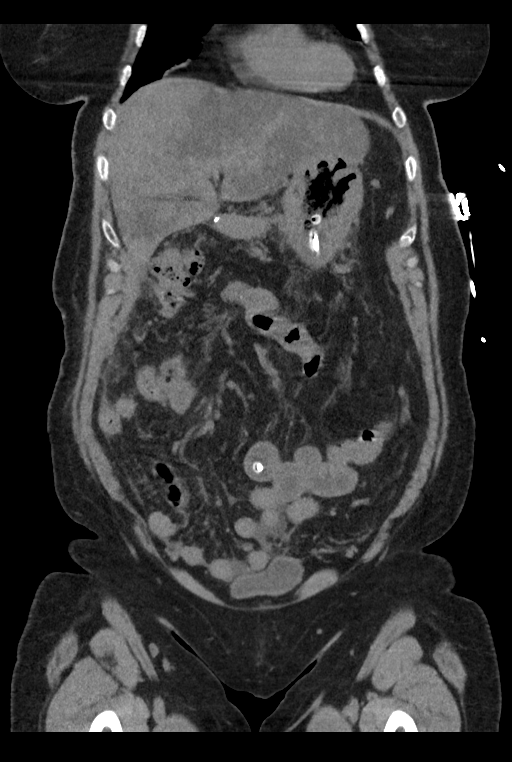
[im 57/127  soft-tissue]
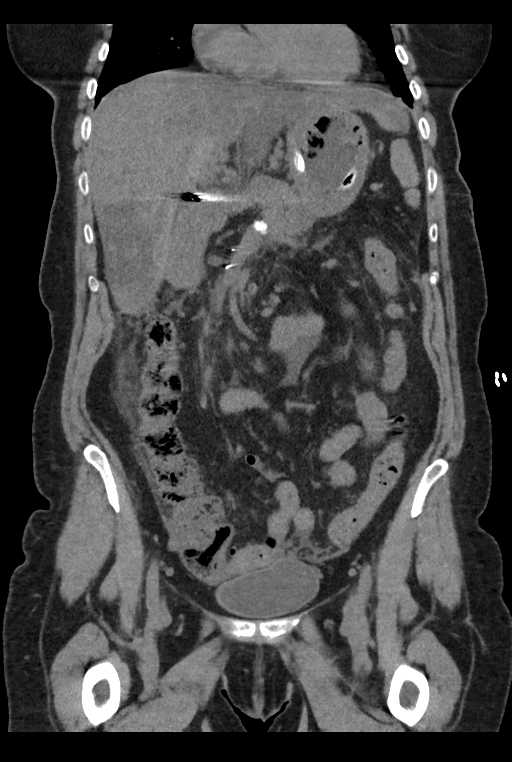
[im 71/127  soft-tissue]
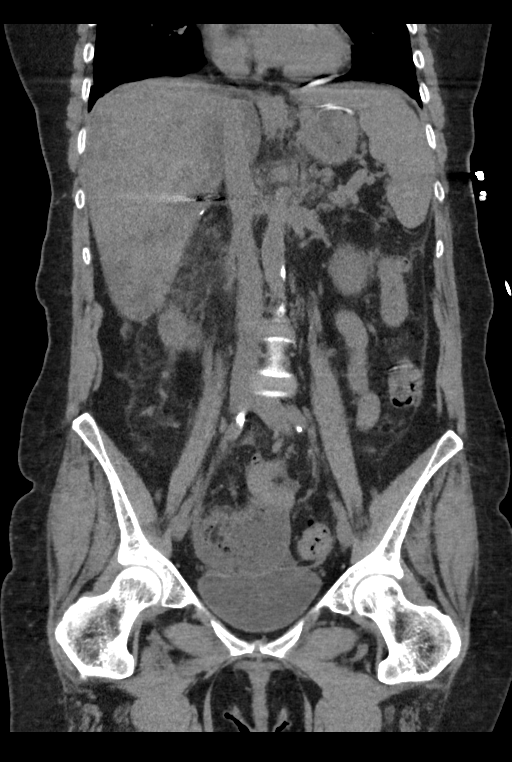

[15 of 46 positions shown; findings below may reference images not displayed]

FINDINGS: Lower chest: No acute abnormality.

Hepatobiliary: Patchy mixed attenuation of the liver, presumably
patchy fatty infiltration. No circumscribed mass seen. Status post
cholecystectomy. No bile duct dilatation appreciated.

Pancreas: Patient is status post Whipple procedure.

Spleen: Normal in size without focal abnormality.

Adrenals/Urinary Tract: Adrenal glands are unremarkable. Kidneys
appear normal without mass, stone or hydronephrosis. No perinephric
fluid. No ureteral or bladder calculi identified. Bladder appears
normal.

Stomach/Bowel: Bowel is normal in caliber. Scattered diverticulosis
within the descending and sigmoid colon without evidence of acute
diverticulitis.

Gastrostomy tube in place. Associated gastrojejunostomy tube appears
appropriately positioned B on the gastric anastomosis.

Vascular/Lymphatic: Aortic atherosclerosis. No enlarged lymph nodes
seen. Scattered small lymph nodes again noted within the right upper
quadrant, largest with a 7 mm short axis measurement, none
pathologic by CT size criteria.

Reproductive: Status post hysterectomy.  No adnexal mass seen.

Other: Small free fluid in the pelvis. No abscess collection seen.
No free intraperitoneal air.

Musculoskeletal: No acute or suspicious osseous finding. Mild
degenerative change within the lumbar spine. Superficial soft
tissues are unremarkable, with expected postsurgical change.
IMPRESSION: 1. No acute findings. No source for acute left-sided pain
identified. No renal or ureteral calculi. No bowel obstruction.
2. Colonic diverticulosis without evidence of acute diverticulitis.
3. Mixed attenuation liver parenchyma, most likely patchy fatty
infiltration. No circumscribed mass or lesion identified although
characterization is limited by the lack of intravascular contrast.
4. Surgical changes, as above. No evidence of surgical complicating
feature.
5. Please note then an addendum on the CT abdomen report of
03/12/2017 described a possible enhancing metastasis within the
right liver lobe versus fatty sparing for which MRI with and without
contrast was recommended for further evaluation.
6. Aortic atherosclerosis.

## 2018-11-22 IMAGING — CT CT ABD-PELV W/ CM
2 of 5 series · 15 of 46 positions shown, 17 images · IV contrast (APPLIED)
Comparison: CT abdomen pelvis 04/24/2017; 03/12/2017.

CLINICAL DATA: Patient with history of GJ tube and Whipple
procedure.

EXAM:
CT ABDOMEN AND PELVIS WITH CONTRAST
TECHNIQUE: Multidetector CT imaging of the abdomen and pelvis was performed
using the standard protocol following bolus administration of
intravenous contrast.
CONTRAST:  100mL DBBCPQ-2FF IOPAMIDOL (DBBCPQ-2FF) INJECTION 61%

[Series 2: routine abd/pel with · axial · 0.74mm/px · z∈[-892,-466]mm · 12 of 95 slices shown, 14 images]
[im 5/95  soft-tissue]
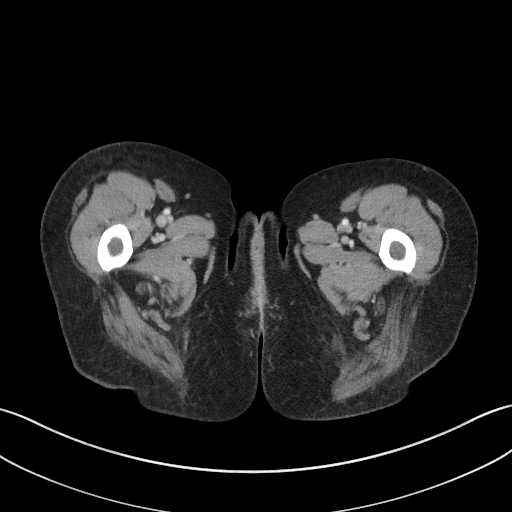
[im 5/95  bone]
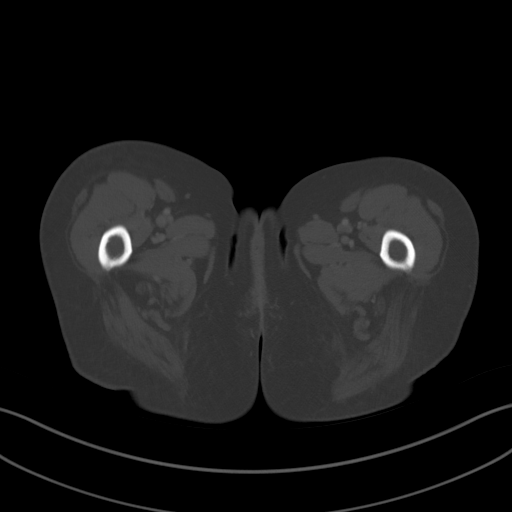
[im 14/95  soft-tissue]
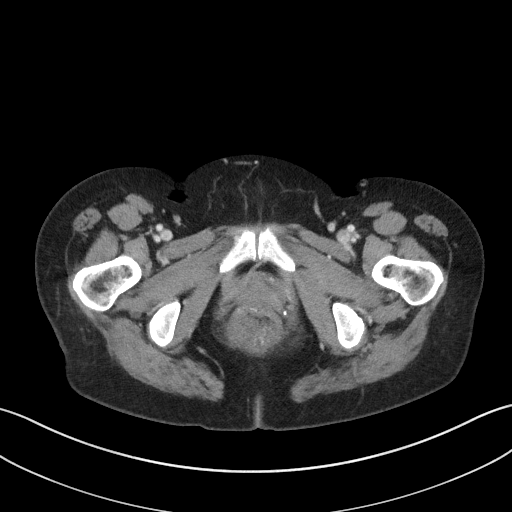
[im 23/95  soft-tissue]
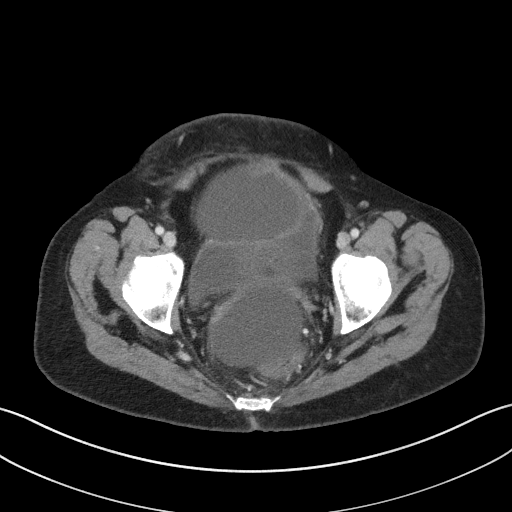
[im 27/95  soft-tissue]
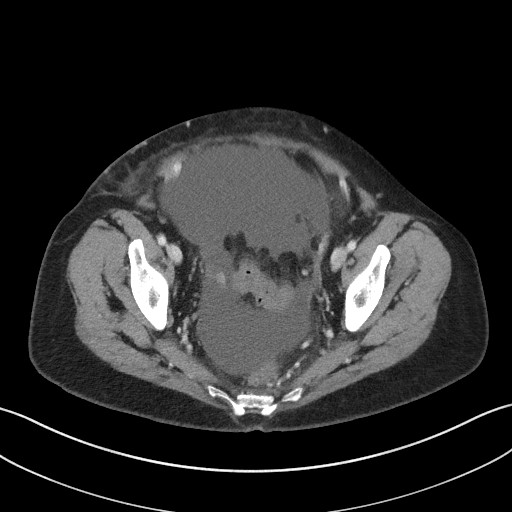
[im 36/95  soft-tissue]
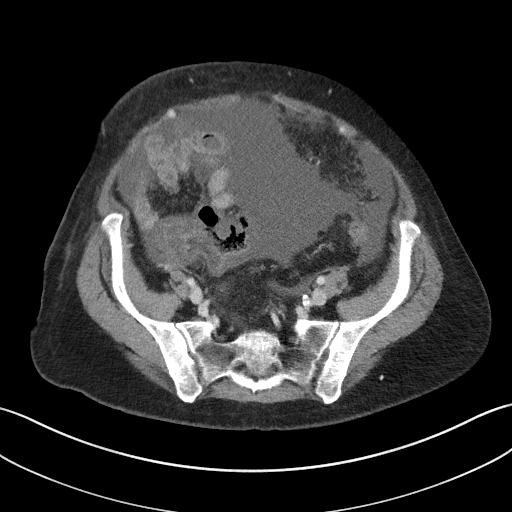
[im 45/95  soft-tissue]
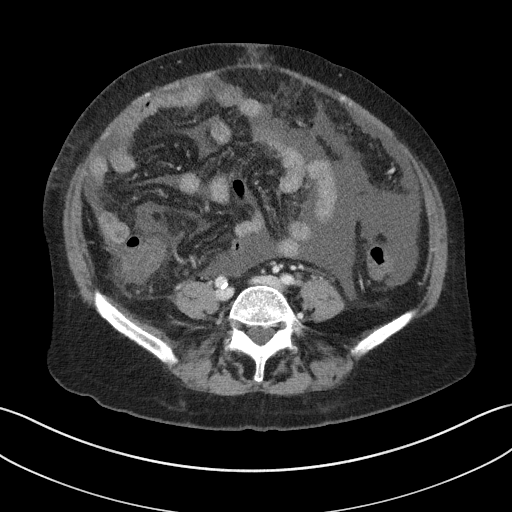
[im 50/95  soft-tissue]
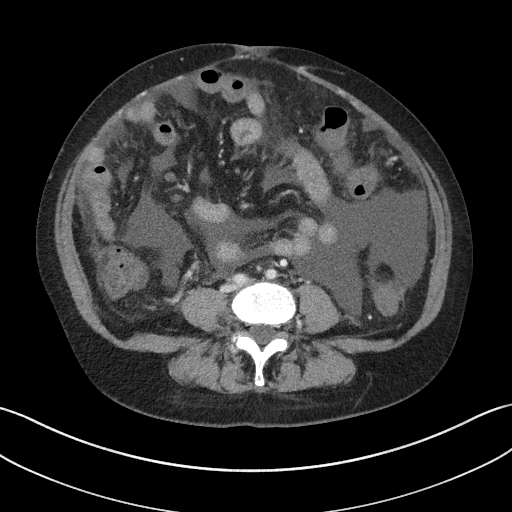
[im 59/95  soft-tissue]
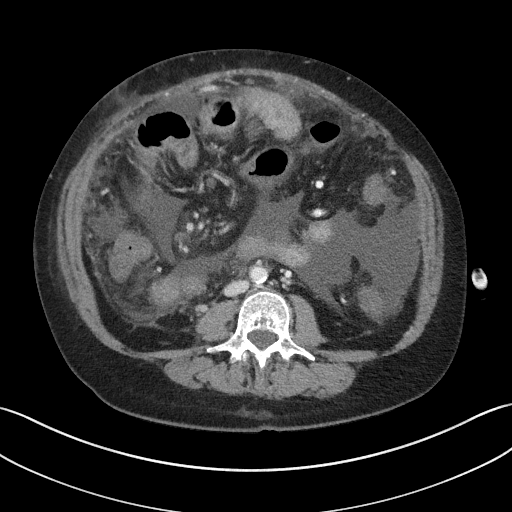
[im 68/95  soft-tissue]
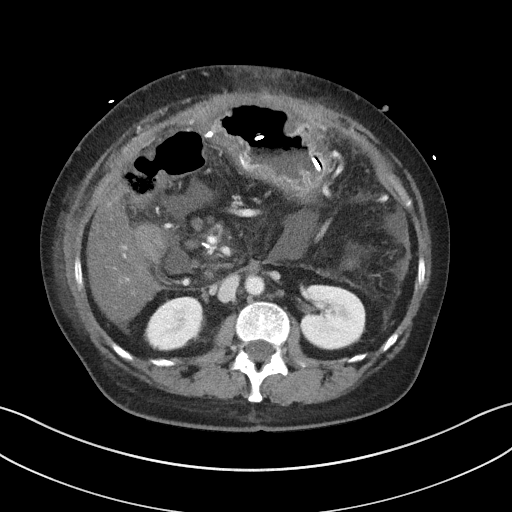
[im 68/95  bone]
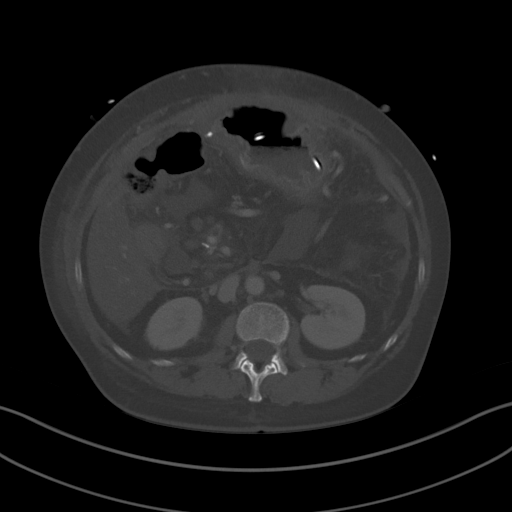
[im 72/95  soft-tissue]
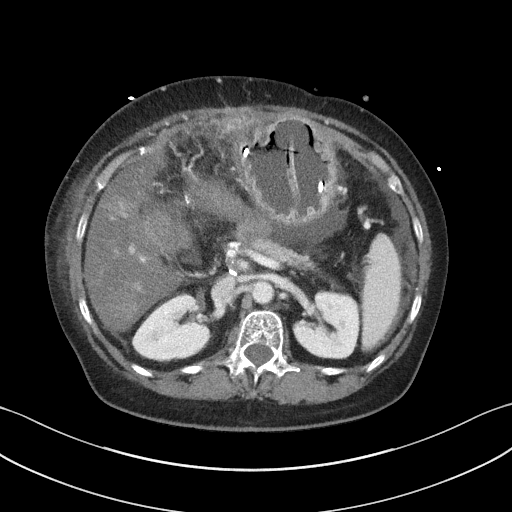
[im 81/95  soft-tissue]
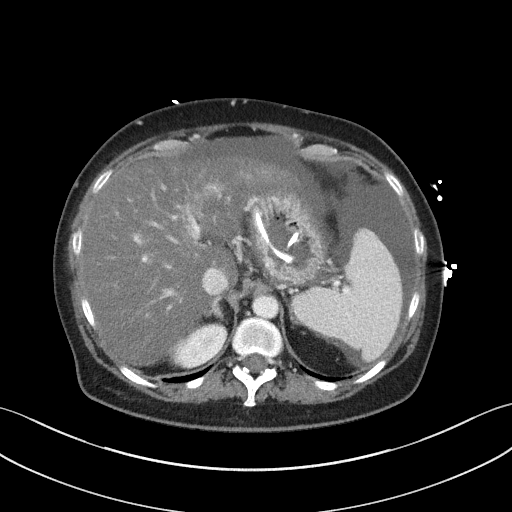
[im 90/95  soft-tissue]
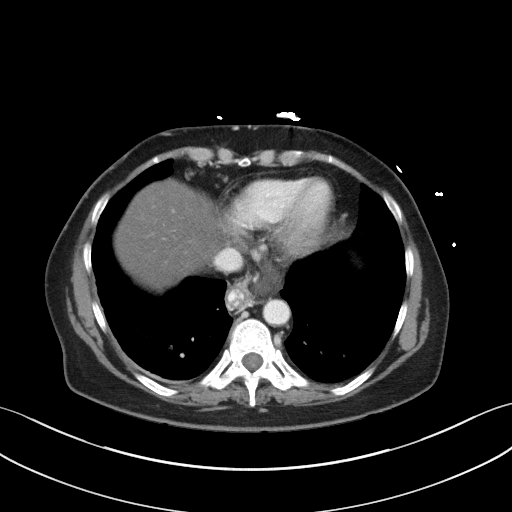

[Series 5: coronal st · coronal · 0.68mm/px · 3 of 90 slices shown]
[im 30/90  soft-tissue]
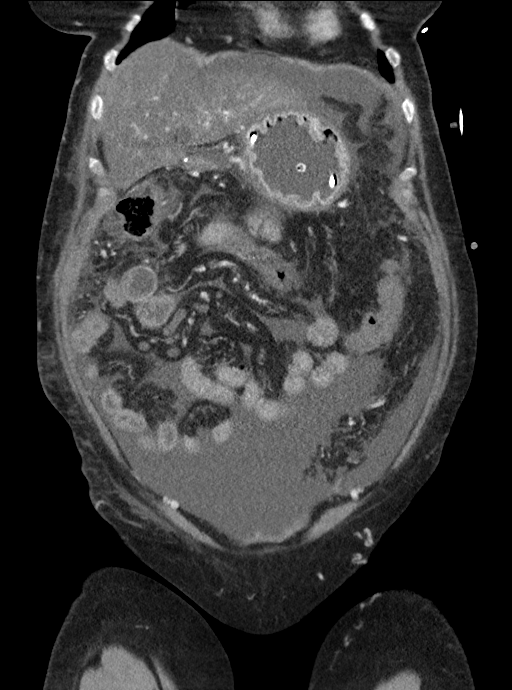
[im 40/90  soft-tissue]
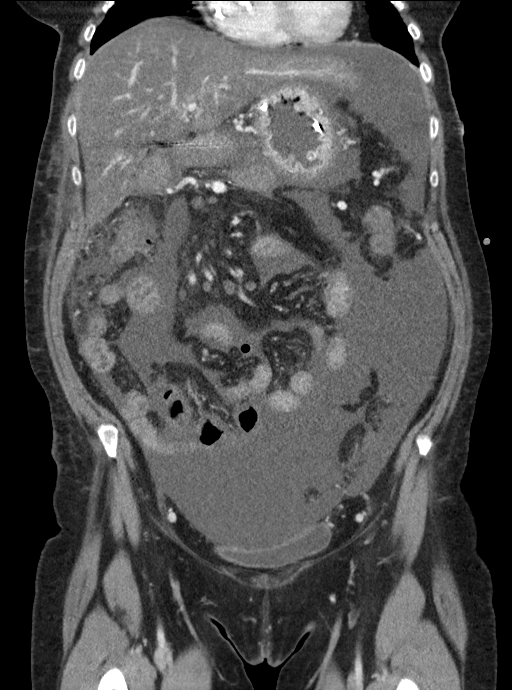
[im 50/90  soft-tissue]
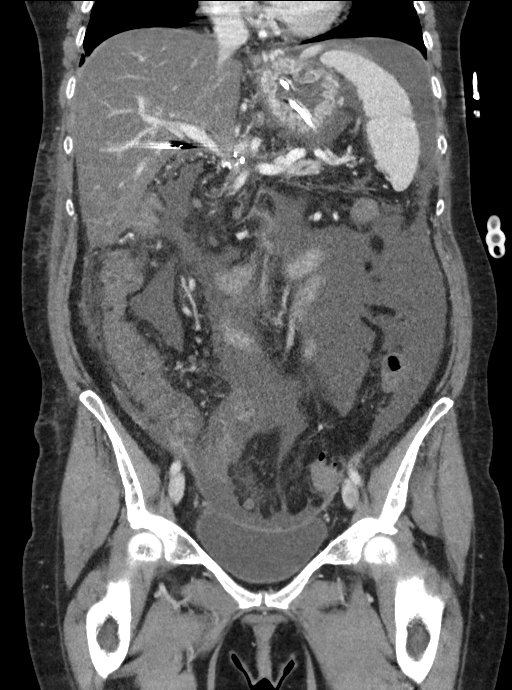

[15 of 46 positions shown; findings below may reference images not displayed]

FINDINGS: Lower chest: Normal heart size. Dependent atelectasis within the
bilateral lower lobes. No pleural effusion.

Hepatobiliary: Liver is diffusely low in attenuation compatible with
steatosis. Within the posterior right hepatic lobe there is a 3.0 x
3.0 cm enhancing mass (image 22; series 2), previously 1.6 x 1.6 cm.

Pancreas: Postsurgical changes compatible with Whipple procedure.

Spleen: Unremarkable

Adrenals/Urinary Tract: The adrenal glands are normal. Kidneys
enhance symmetrically with contrast. No hydronephrosis. Urinary
bladder is unremarkable.

Stomach/Bowel: Percutaneous GJ tube is present and coiled within the
stomach. The distal aspect of the tube terminates within the GE
junction (image 10; series 2). There is marked circumferential wall
thickening of the cecum and ascending colon. No evidence for bowel
obstruction. There is wall thickening of the rectum (image 80;
series 2). Gas is demonstrated within small bowel diverticuli within
the right lower quadrant.

Vascular/Lymphatic: Normal caliber abdominal aorta. Peripheral
calcified atherosclerotic plaque. No retroperitoneal
lymphadenopathy.

Reproductive: Status posthysterectomy.

Other: Interval increase in an moderate volume ascites.

Musculoskeletal: No aggressive or acute appearing osseous lesions.
Lumbar spine degenerative changes.
IMPRESSION: Marked circumferential wall thickening of the cecum and ascending
colon most compatible with colitis. Considerations include
infectious, ischemic or inflammatory etiologies.

Mild wall thickening of the rectum most compatible with
proctocolitis.

Malpositioned enteric tube coiled in the stomach with the tip
projecting at the GE junction.

Interval increase in size of enhancing lesion within the right
hepatic lobe most compatible with hepatic metastatic disease.

Increasing moderate volume ascites.

Hepatic steatosis.

These results were called by telephone at the time of interpretation
on 05/16/2017 at [DATE] to Dr. [[facility]OS[facility]] [id number] , who verbally
acknowledged these results.
